# Patient Record
Sex: Female | Born: 1990 | Race: Black or African American | Hispanic: No | Marital: Single | Smoking: Never smoker
Health system: Southern US, Community
[De-identification: ages and names within clinical notes are randomized; demographics above are authoritative.]

## PROBLEM LIST (undated history)

## (undated) ENCOUNTER — Inpatient Hospital Stay (HOSPITAL_COMMUNITY): Payer: Self-pay

## (undated) DIAGNOSIS — Z789 Other specified health status: Secondary | ICD-10-CM

---
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
## 2017-01-19 ENCOUNTER — Inpatient Hospital Stay (HOSPITAL_COMMUNITY): Payer: 59

## 2017-01-19 ENCOUNTER — Inpatient Hospital Stay (HOSPITAL_COMMUNITY)
Admission: AD | Admit: 2017-01-19 | Discharge: 2017-01-19 | Disposition: A | Discharge status: Home / Self Care | Payer: 59 | Source: Ambulatory Visit | Attending: Obstetrics and Gynecology | Admitting: Obstetrics and Gynecology

## 2017-01-19 DIAGNOSIS — O3680X0 Pregnancy with inconclusive fetal viability, not applicable or unspecified: Secondary | ICD-10-CM

## 2017-01-19 DIAGNOSIS — O209 Hemorrhage in early pregnancy, unspecified: Secondary | ICD-10-CM

## 2017-01-19 DIAGNOSIS — O200 Threatened abortion: Secondary | ICD-10-CM | POA: Diagnosis not present

## 2017-01-19 DIAGNOSIS — Z3A01 Less than 8 weeks gestation of pregnancy: Secondary | ICD-10-CM | POA: Insufficient documentation

## 2017-01-19 DIAGNOSIS — O26891 Other specified pregnancy related conditions, first trimester: Secondary | ICD-10-CM | POA: Diagnosis not present

## 2017-01-19 DIAGNOSIS — N898 Other specified noninflammatory disorders of vagina: Secondary | ICD-10-CM | POA: Insufficient documentation

## 2017-01-19 LAB — URINALYSIS, ROUTINE W REFLEX MICROSCOPIC
BILIRUBIN URINE: NEGATIVE
Glucose, UA: 50 mg/dL — AB
KETONES UR: NEGATIVE mg/dL
Nitrite: NEGATIVE
PROTEIN: 30 mg/dL — AB
SPECIFIC GRAVITY, URINE: 1.011 (ref 1.005–1.030)
pH: 5 (ref 5.0–8.0)

## 2017-01-19 LAB — CBC WITH DIFFERENTIAL/PLATELET
BASOS ABS: 0 10*3/uL (ref 0.0–0.1)
Basophils Relative: 0 %
EOS ABS: 0.1 10*3/uL (ref 0.0–0.7)
EOS PCT: 1 %
HCT: 33.7 % — ABNORMAL LOW (ref 36.0–46.0)
Hemoglobin: 11.1 g/dL — ABNORMAL LOW (ref 12.0–15.0)
Lymphocytes Relative: 22 %
Lymphs Abs: 2.9 10*3/uL (ref 0.7–4.0)
MCH: 24.3 pg — ABNORMAL LOW (ref 26.0–34.0)
MCHC: 32.9 g/dL (ref 30.0–36.0)
MCV: 73.9 fL — ABNORMAL LOW (ref 78.0–100.0)
MONO ABS: 0.6 10*3/uL (ref 0.1–1.0)
Monocytes Relative: 4 %
Neutro Abs: 9.7 10*3/uL — ABNORMAL HIGH (ref 1.7–7.7)
Neutrophils Relative %: 73 %
PLATELETS: 345 10*3/uL (ref 150–400)
RBC: 4.56 MIL/uL (ref 3.87–5.11)
RDW: 17.2 % — AB (ref 11.5–15.5)
WBC: 13.4 10*3/uL — AB (ref 4.0–10.5)

## 2017-01-19 LAB — POCT PREGNANCY, URINE: PREG TEST UR: POSITIVE — AB

## 2017-01-19 LAB — HCG, QUANTITATIVE, PREGNANCY: HCG, BETA CHAIN, QUANT, S: 3932 m[IU]/mL — AB (ref <5)

## 2017-01-19 NOTE — MAU Note (Signed)
Pt had positive HPT last week. Woke up around 1am and startd having abd pain and cramping. Went to the br and had some vag bleeding and clots. Continuing to bleeding now cramping is less. Thinks she might have had a miscarriage. Went to urgent care and they sent her here for furhter evaluation.

## 2017-01-19 NOTE — MAU Provider Note (Signed)
History     CSN: [id number]  Arrival date and time: 01/19/17 4098   First Provider Initiated Contact with Patient 01/19/17 2106      Chief Complaint  Patient presents with  . Abdominal Cramping  . Vaginal Bleeding   Vaginal Bleeding  The patient's primary symptoms include pelvic pain and vaginal bleeding. This is a new problem. The current episode started today. The problem occurs constantly. The problem has been unchanged. Pain severity now: 3/10  The problem affects both sides. She is pregnant. Associated symptoms include nausea and vomiting. Pertinent negatives include no chills, dysuria, fever or urgency. The vaginal discharge was bloody. The vaginal bleeding is typical of menses. She has been passing clots. She has not been passing tissue. Nothing aggravates the symptoms. She has tried NSAIDs for the symptoms. The treatment provided moderate relief. Her menstrual history has been regular (LMP 12/08/16).    No past medical history on file.  No past surgical history on file.  No family history on file.  Social History  Substance Use Topics  . Smoking status: Not on file  . Smokeless tobacco: Not on file  . Alcohol use Not on file    Allergies: Allergies not on file  No prescriptions prior to admission.    Review of Systems  Constitutional: Negative for chills and fever.  Gastrointestinal: Positive for nausea and vomiting.  Genitourinary: Positive for pelvic pain and vaginal bleeding. Negative for dysuria and urgency.   Physical Exam   Blood pressure 124/86, pulse 102, temperature 98.8 F (37.1 C), resp. rate 18, height 5\' 3"  (1.6 m), weight 209 lb 1.9 oz (94.9 kg), last menstrual period 12/08/2016.  Physical Exam  Nursing note and vitals reviewed. Constitutional: She is oriented to person, place, and time. She appears well-developed and well-nourished. No distress.  HENT:  Head: Normocephalic.  Cardiovascular: Normal rate.   Respiratory: Effort normal.  GI:  Soft. There is no tenderness. There is no rebound.  Neurological: She is alert and oriented to person, place, and time.  Skin: Skin is warm and dry.  Psychiatric: She has a normal mood and affect.   Results for orders placed or performed during the hospital encounter of 01/19/17 (from the past 24 hour(s))  Urinalysis, Routine w reflex microscopic     Status: Abnormal   Collection Time: 01/19/17  6:44 PM  Result Value Ref Range   Color, Urine YELLOW YELLOW   APPearance HAZY (A) CLEAR   Specific Gravity, Urine 1.011 1.005 - 1.030   pH 5.0 5.0 - 8.0   Glucose, UA 50 (A) NEGATIVE mg/dL   Hgb urine dipstick LARGE (A) NEGATIVE   Bilirubin Urine NEGATIVE NEGATIVE   Ketones, ur NEGATIVE NEGATIVE mg/dL   Protein, ur 30 (A) NEGATIVE mg/dL   Nitrite NEGATIVE NEGATIVE   Leukocytes, UA SMALL (A) NEGATIVE   RBC / HPF TOO NUMEROUS TO COUNT 0 - 5 RBC/hpf   WBC, UA TOO NUMEROUS TO COUNT 0 - 5 WBC/hpf   Bacteria, UA RARE (A) NONE SEEN   Squamous Epithelial / LPF 0-5 (A) NONE SEEN   Mucous PRESENT   Pregnancy, urine POC     Status: Abnormal   Collection Time: 01/19/17  6:54 PM  Result Value Ref Range   Preg Test, Ur POSITIVE (A) NEGATIVE  CBC with Differential/Platelet     Status: Abnormal   Collection Time: 01/19/17  9:21 PM  Result Value Ref Range   WBC 13.4 (H) 4.0 - 10.5 K/uL   RBC 4.56  3.87 - 5.11 MIL/uL   Hemoglobin 11.1 (L) 12.0 - 15.0 g/dL   HCT 16.133.7 (L) 09.636.0 - 04.546.0 %   MCV 73.9 (L) 78.0 - 100.0 fL   MCH 24.3 (L) 26.0 - 34.0 pg   MCHC 32.9 30.0 - 36.0 g/dL   RDW 40.917.2 (H) 81.111.5 - 91.415.5 %   Platelets 345 150 - 400 K/uL   Neutrophils Relative % 73 %   Neutro Abs 9.7 (H) 1.7 - 7.7 K/uL   Lymphocytes Relative 22 %   Lymphs Abs 2.9 0.7 - 4.0 K/uL   Monocytes Relative 4 %   Monocytes Absolute 0.6 0.1 - 1.0 K/uL   Eosinophils Relative 1 %   Eosinophils Absolute 0.1 0.0 - 0.7 K/uL   Basophils Relative 0 %   Basophils Absolute 0.0 0.0 - 0.1 K/uL  hCG, quantitative, pregnancy     Status:  Abnormal   Collection Time: 01/19/17  9:21 PM  Result Value Ref Range   hCG, Beta Chain, Quant, S 3,932 (H) <5 mIU/mL  ABO/Rh     Status: None (Preliminary result)   Collection Time: 01/19/17  9:21 PM  Result Value Ref Range   ABO/RH(D) O POS    [place] Ob Comp Less 14 Wks  Result Date: 01/19/2017 CLINICAL DATA:  Abdominal pain, cramps, and vaginal bleeding today. Estimated gestational age by LMP is 6 weeks 0 days. Quantitative beta HCG is 3,932 EXAM: OBSTETRIC <14 WK US AND TRANSVAGINAL OB US TECHNIQUE: Both transabdominal and transvaginal ultrasound examinations were performed for complete evaluation of the gestation as well as the maternal uterus, adnexal regions, and pelvic cul-de-sac. Transvaginal technique was performed to assess early pregnancy. COMPARISON:  None. FINDINGS: Intrauterine gestational sac: No intrauterine gestational sac is identified. Yolk sac:  Not visualized. Embryo:  Not Visualized. Cardiac Activity: Not Visualized. Maternal uterus/adnexae: Uterus is anteverted. No myometrial mass lesions identified. Endometrial stripe is mildly thickened at 1.4 cm diameter. Endometrium is heterogeneous with swirling of endometrium material noted during real-time imaging. No focal lesion is identified. Both ovaries are visualized and appear normal. Probable corpus luteal cyst on the right ovary. No abnormal adnexal mass lesions are seen. No free fluid in the pelvis. IMPRESSION: No intrauterine gestational sac, yolk sac, or fetal pole identified. Differential considerations include intrauterine pregnancy too early to be sonographically visualized, missed abortion, or ectopic pregnancy. Followup ultrasound is recommended in 10-14 days for further evaluation. Electronically Signed   By: Burman NievesWilliam  Stevens M.D.   On: 01/19/2017 23:08   [place] Ob Transvaginal  Result Date: 01/19/2017 CLINICAL DATA:  Abdominal pain, cramps, and vaginal bleeding today. Estimated gestational age by LMP is 6 weeks 0 days.  Quantitative beta HCG is 3,932 EXAM: OBSTETRIC <14 WK US AND TRANSVAGINAL OB US TECHNIQUE: Both transabdominal and transvaginal ultrasound examinations were performed for complete evaluation of the gestation as well as the maternal uterus, adnexal regions, and pelvic cul-de-sac. Transvaginal technique was performed to assess early pregnancy. COMPARISON:  None. FINDINGS: Intrauterine gestational sac: No intrauterine gestational sac is identified. Yolk sac:  Not visualized. Embryo:  Not Visualized. Cardiac Activity: Not Visualized. Maternal uterus/adnexae: Uterus is anteverted. No myometrial mass lesions identified. Endometrial stripe is mildly thickened at 1.4 cm diameter. Endometrium is heterogeneous with swirling of endometrium material noted during real-time imaging. No focal lesion is identified. Both ovaries are visualized and appear normal. Probable corpus luteal cyst on the right ovary. No abnormal adnexal mass lesions are seen. No free fluid in the pelvis. IMPRESSION: No intrauterine gestational sac,  yolk sac, or fetal pole identified. Differential considerations include intrauterine pregnancy too early to be sonographically visualized, missed abortion, or ectopic pregnancy. Followup ultrasound is recommended in 10-14 days for further evaluation. Electronically Signed   By: Burman Nieves M.D.   On: 01/19/2017 23:08   MAU Course  Procedures  MDM 2316: D/W Dr. Tenny Craw, ok for DC home. Will have patient return on Saturday for repeat HCG.   Assessment and Plan   1. Threatened miscarriage in early pregnancy   2. Vaginal bleeding in pregnancy, first trimester   3. Pregnancy of unknown anatomic location    DC home Comfort measures reviewed  1st Trimester precautions  Bleeding precautions Ectopic precautions RX: none  Return to MAU as needed FU with OB as planned  Follow-up Information    THE [facility] [place] MATERNITY ADMISSIONS Follow up.   Why:  Return Saturday for repeat HCG   Contact information: [address] [id number] [place] [address] [phone]           Tawnya Crook 01/19/2017, 9:07 PM

## 2017-01-19 NOTE — Discharge Instructions (Signed)
Threatened Miscarriage A threatened miscarriage is when you have vaginal bleeding during your first 20 weeks of pregnancy but the pregnancy has not ended. Your doctor will do tests to make sure you are still pregnant. The cause of the bleeding may not be known. This condition does not mean your pregnancy will end. It does increase the risk of it ending (complete miscarriage). Follow these instructions at home:  Make sure you keep all your doctor visits for prenatal care.  Get plenty of rest.  Do not have sex or use tampons if you have vaginal bleeding.  Do not douche.  Do not smoke or use drugs.  Do not drink alcohol.  Avoid caffeine. Contact a doctor if:  You have light bleeding from your vagina.  You have belly pain or cramping.  You have a fever. Get help right away if:  You have heavy bleeding from your vagina.  You have clots of blood coming from your vagina.  You have bad pain or cramps in your low back or belly.  You have fever, chills, and bad belly pain. This information is not intended to replace advice given to you by your health care provider. Make sure you discuss any questions you have with your health care provider. Document Released: 11/10/2008 Document Revised: 05/05/2016 Document Reviewed: 09/24/2013 Elsevier Interactive Patient Education  2017 [employer] Ectopic Pregnancy An ectopic pregnancy is when the fertilized egg attaches (implants) outside the uterus. Most ectopic pregnancies occur in one of the tubes where eggs travel from the ovary to the uterus (fallopian tubes), but the implanting can occur in other locations. In rare cases, ectopic pregnancies occur on the ovary, intestine, pelvis, abdomen, or cervix. In an ectopic pregnancy, the fertilized egg does not have the ability to develop into a normal, healthy baby. A ruptured ectopic pregnancy is one in which tearing or bursting of a fallopian tube causes internal bleeding. Often, there is  intense lower abdominal pain, and vaginal bleeding sometimes occurs. Having an ectopic pregnancy can be life-threatening. If this dangerous condition is not treated, it can lead to blood loss, shock, or even death. What are the causes? The most common cause of this condition is damage to one of the fallopian tubes. A fallopian tube may be narrowed or blocked, and that keeps the fertilized egg from reaching the uterus. What increases the risk? This condition is more likely to develop in women of childbearing age who have different levels of risk. The levels of risk can be divided into three categories. High risk  You have gone through infertility treatment.  You have had an ectopic pregnancy before.  You have had surgery on the fallopian tubes, or another surgical procedure, such as an abortion.  You have had surgery to have the fallopian tubes tied (tubal ligation).  You have problems or diseases of the fallopian tubes.  You have been exposed to diethylstilbestrol (DES). This medicine was used until 1971, and it had effects on babies whose mothers took the medicine.  You become pregnant while using an IUD (intrauterine device) for birth control. Moderate risk  You have a history of infertility.  You have had an STI (sexually transmitted infection).  You have a history of pelvic inflammatory disease (PID).  You have scarring from endometriosis.  You have multiple sexual partners.  You smoke. Low risk  You have had pelvic surgery.  You use vaginal douches.  You became sexually active before age 52. What are the signs or symptoms? Common  symptoms of this condition include normal pregnancy symptoms, such as missing a period, nausea, tiredness, abdominal pain, breast tenderness, and bleeding. However, ectopic pregnancy will have additional symptoms, such as:  Pain with intercourse.  Irregular vaginal bleeding or spotting.  Cramping or pain on one side or in the lower  abdomen.  Fast heartbeat, low blood pressure, and sweating.  Passing out while having a bowel movement. Symptoms of a ruptured ectopic pregnancy and internal bleeding may include:  Sudden, severe pain in the abdomen and pelvis.  Dizziness, weakness, light-headedness, or fainting.  Pain in the shoulder or neck area. How is this diagnosed? This condition is diagnosed by:  A pelvic exam to locate pain or a mass in the abdomen.  A pregnancy test. This blood test checks for the presence as well as the specific level of pregnancy hormone in the bloodstream.  Ultrasound. This is performed if a pregnancy test is positive. In this test, a probe is inserted into the vagina. The probe will detect a fetus, possibly in a location other than the uterus.  Taking a sample of uterus tissue (dilation and curettage, or D&C).  Surgery to perform a visual exam of the inside of the abdomen using a thin, lighted tube that has a tiny camera on the end (laparoscope).  Culdocentesis. This procedure involves inserting a needle at the top of the vagina, behind the uterus. If blood is present in this area, it may indicate that a fallopian tube is torn. How is this treated? This condition is treated with medicine or surgery. Medicine  An injection of a medicine (methotrexate) may be given to cause the pregnancy tissue to be absorbed. This medicine may save your fallopian tube. It may be given if:  The diagnosis is made early, with no signs of active bleeding.  The fallopian tube has not ruptured.  You are considered to be a good candidate for the medicine. Usually, pregnancy hormone blood levels are checked after methotrexate treatment. This is to be sure that the medicine is effective. It may take 4-6 weeks for the pregnancy to be absorbed. Most pregnancies will be absorbed by 3 weeks. Surgery  A laparoscope may be used to remove the pregnancy tissue.  If severe internal bleeding occurs, a larger cut  (incision) may be made in the lower abdomen (laparotomy) to remove the fetus and placenta. This is done to stop the bleeding.  Part or all of the fallopian tube may be removed (salpingectomy) along with the fetus and placenta. The fallopian tube may also be repaired during the surgery.  In very rare circumstances, removal of the uterus (hysterectomy) may be required.  After surgery, pregnancy hormone testing may be done to be sure that there is no pregnancy tissue left. Whether your treatment is medicine or surgery, you may receive a Rho (D) immune globulin shot to prevent problems with any future pregnancy. This shot may be given if:  You are Rh-negative and the baby's father is Rh-positive.  You are Rh-negative and you do not know the Rh type of the baby's father. Follow these instructions at home:  Rest and limit your activity after the procedure for as long as told by your health care provider.  Until your health care provider says that it is safe:  Do not lift anything that is heavier than 10 lb (4.5 kg), or the limit that your health care provider tells you.  Avoid physical exercise and any movement that requires effort (is strenuous).  To help  prevent constipation:  Eat a healthy diet that includes fruits, vegetables, and whole grains.  Drink 6-8 glasses of water per day. Get help right away if:  You develop worsening pain that is not relieved by medicine.  You have:  A fever or chills.  Vaginal bleeding.  Redness and swelling at the incision site.  Nausea and vomiting.  You feel dizzy or weak.  You feel light-headed or you faint. This information is not intended to replace advice given to you by your health care provider. Make sure you discuss any questions you have with your health care provider. Document Released: 01/05/2005 Document Revised: 07/27/2016 Document Reviewed: 06/29/2016 Elsevier Interactive Patient Education  2017 [employer].

## 2017-01-20 LAB — ABO/RH: ABO/RH(D): O POS

## 2017-01-21 ENCOUNTER — Encounter (HOSPITAL_COMMUNITY): Payer: Self-pay | Admitting: *Deleted

## 2017-01-21 ENCOUNTER — Inpatient Hospital Stay (HOSPITAL_COMMUNITY)
Admission: AD | Admit: 2017-01-21 | Discharge: 2017-01-21 | Disposition: A | Discharge status: Home / Self Care | Payer: 59 | Source: Ambulatory Visit | Attending: Obstetrics and Gynecology | Admitting: Obstetrics and Gynecology

## 2017-01-21 DIAGNOSIS — O039 Complete or unspecified spontaneous abortion without complication: Secondary | ICD-10-CM | POA: Diagnosis not present

## 2017-01-21 LAB — HCG, QUANTITATIVE, PREGNANCY: hCG, Beta Chain, Quant, S: 678 m[IU]/mL — ABNORMAL HIGH (ref <5)

## 2017-01-21 NOTE — MAU Note (Signed)
Judeth HornErin Lawrence NP in Triage to discuss test result and plan of care with pt

## 2017-01-21 NOTE — MAU Provider Note (Signed)
History   [id number]   Chief Complaint  Patient presents with  . Follow-up    HPI Megan Mercado is a 26 y.o. female G1P0 here for follow-up BHCG.  Upon review of the records patient was first seen on 2/8 for vaginal bleeding. BHCG on that day was 3,932. Ultrasound showed no iup or adnexal mass.Pt discharged home with ectopic precautions to f/u for repeat BHCG. Pt here today with no report of abdominal pain or vaginal bleeding. All other systems negative.    Patient's last menstrual period was 12/08/2016.  OB History  Gravida Para Term Preterm AB Living  1            SAB TAB Ectopic Multiple Live Births               # Outcome Date GA Lbr Len/2nd Weight Sex Delivery Anes PTL Lv  1 Current               No past medical history on file.  No family history on file.  Social History   Social History  . Marital status: Single    Spouse name: N/A  . Number of children: N/A  . Years of education: N/A   Social History Main Topics  . Smoking status: Not on file  . Smokeless tobacco: Not on file  . Alcohol use Not on file  . Drug use: Unknown  . Sexual activity: Not on file   Other Topics Concern  . Not on file   Social History Narrative  . No narrative on file    Allergies not on file  No current facility-administered medications on file prior to encounter.    No current outpatient prescriptions on file prior to encounter.     Physical Exam   Vitals:   01/21/17 2053  BP: 124/71  Pulse: 99  Resp: 18  Temp: 98.7 F (37.1 C)  Weight: 211 lb 3.2 oz (95.8 kg)  Height: 5\' 3"  (1.6 m)    Physical Exam  Nursing note and vitals reviewed. Constitutional: She is oriented to person, place, and time. She appears well-developed and well-nourished. No distress.  HENT:  Head: Normocephalic and atraumatic.  Eyes: Conjunctivae are normal. Right eye exhibits no discharge. Left eye exhibits no discharge. No scleral icterus.  Neck: Normal range of motion.   Respiratory: Effort normal. No respiratory distress.  Neurological: She is alert and oriented to person, place, and time.  Skin: Skin is warm and dry. She is not diaphoretic.  Psychiatric: She has a normal mood and affect. Her behavior is normal. Judgment and thought content normal.    MAU Course  Procedures Results for orders placed or performed during the hospital encounter of 01/21/17 (from the past 24 hour(s))  hCG, quantitative, pregnancy     Status: Abnormal   Collection Time: 01/21/17  8:53 PM  Result Value Ref Range   hCG, Beta Chain, Quant, S 678 (H) <5 mIU/mL    MDM Component     Latest Ref Rng & Units 01/19/2017 01/21/2017  HCG, Beta Chain, Quant, S     <5 mIU/mL 3,932 (H) 678 (H)   Significant drop in BHCG -- discussed results with patient S/w Dr. Henderson Cloud regarding results. Will have pt f/u in office in 2 weeks  Assessment and Plan  26 y.o. G1P0 at Unknown wks Pregnancy Follow-up BHCG 1. Miscarriage    P: Discharge home Comfort pack given Schedule f/u in office in 2 weeks Discussed reasons to return to MAU  Pelvic rest   Judeth HornErin Lawrence, NP 01/21/2017 10:03 PM

## 2017-01-21 NOTE — Discharge Instructions (Signed)

## 2017-01-21 NOTE — MAU Note (Signed)
Here for repeat lab work. Bleeding is getting a lot lighter than previously. No pain currently. Had some abd pain earlier but no pain now

## 2017-11-28 IMAGING — US US OB COMP LESS 14 WK
1 series · 15 of 28 positions shown · non-contrast
Comparison: None.

CLINICAL DATA: Abdominal pain, cramps, and vaginal bleeding today.
Estimated gestational age by LMP is 6 weeks 0 days. Quantitative
beta HCG is 3,932

EXAM:
OBSTETRIC <14 WK US AND TRANSVAGINAL OB US
TECHNIQUE: Both transabdominal and transvaginal ultrasound examinations were
performed for complete evaluation of the gestation as well as the
maternal uterus, adnexal regions, and pelvic cul-de-sac.
Transvaginal technique was performed to assess early pregnancy.

[Series 1: us ob comp less 14 wk · 15 of 50 slices shown]
[im 1/50]
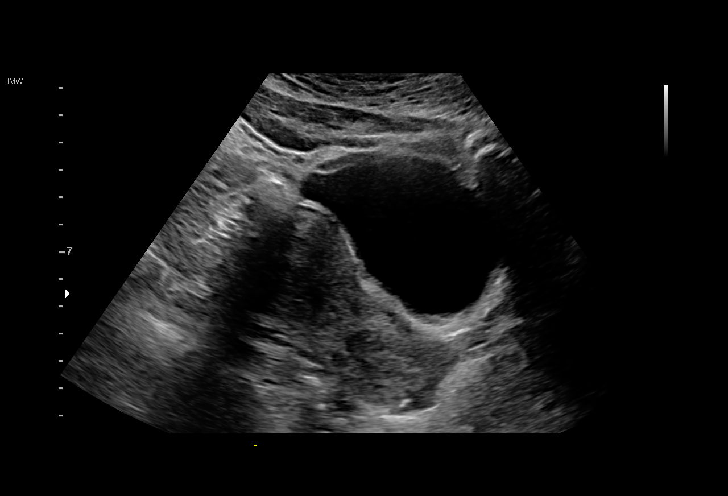
[im 4/50]
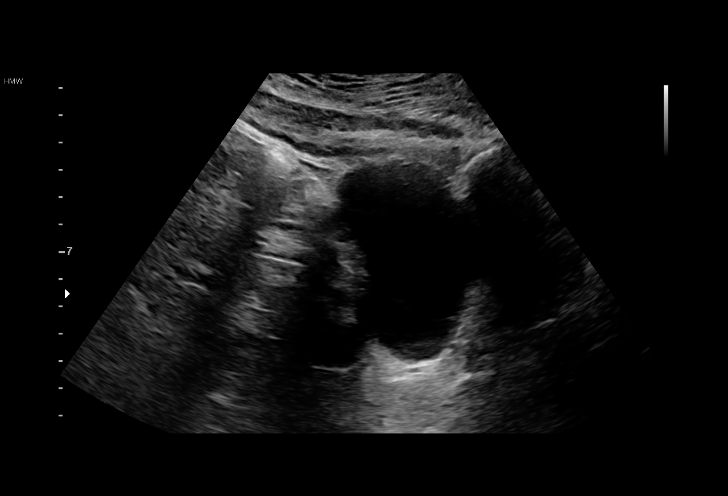
[im 8/50]
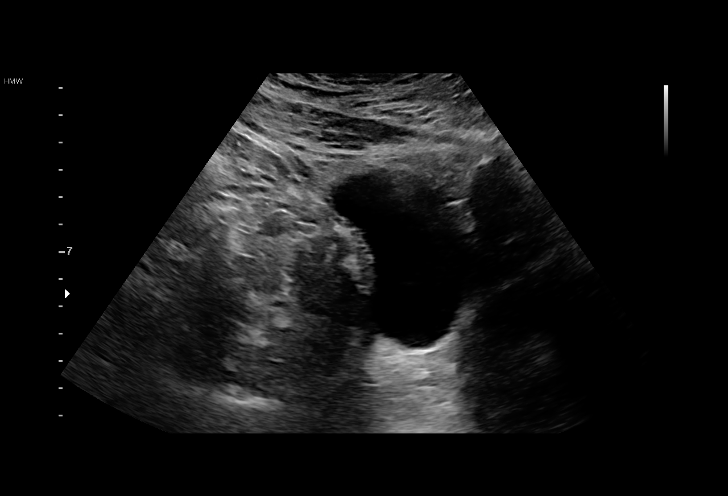
[im 11/50]
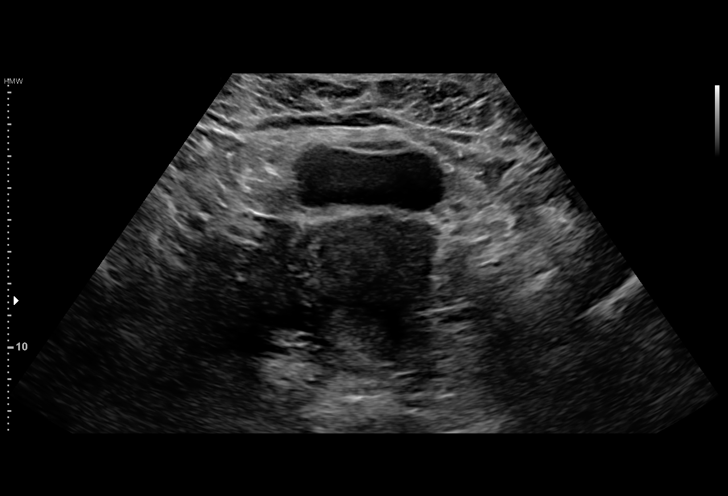
[im 15/50]
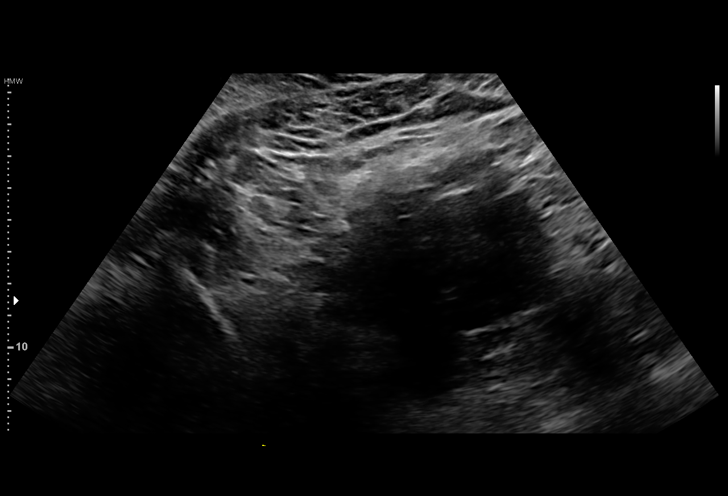
[im 19/50]
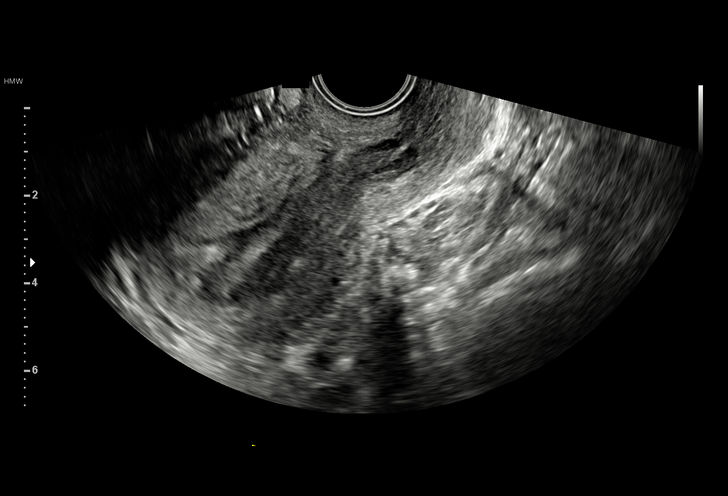
[im 22/50]
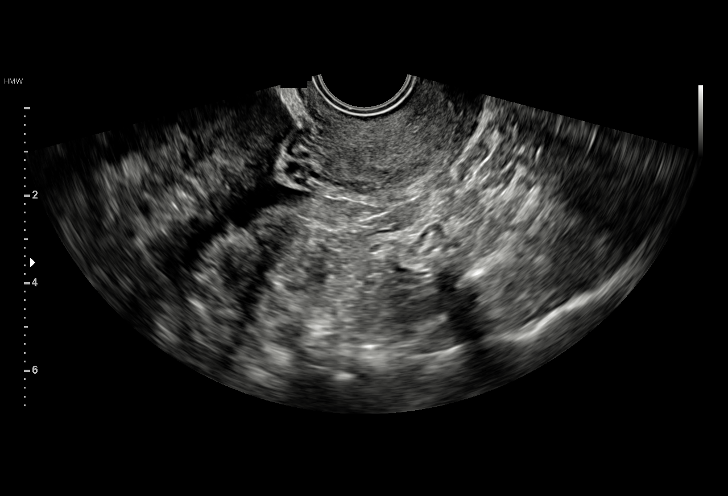
[im 26/50]
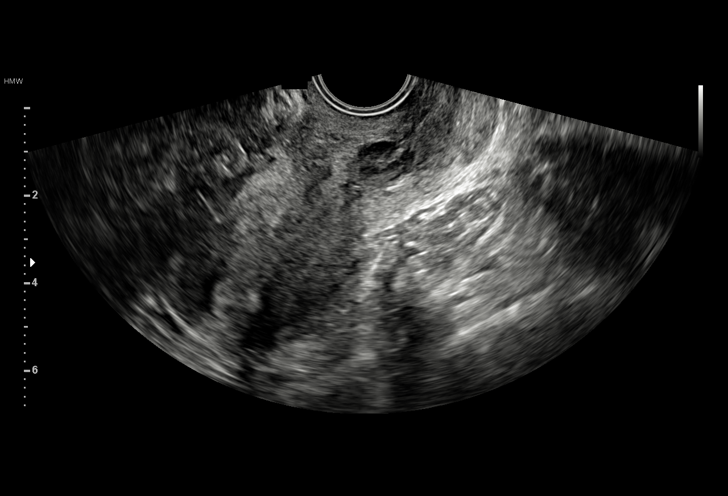
[im 28/50]
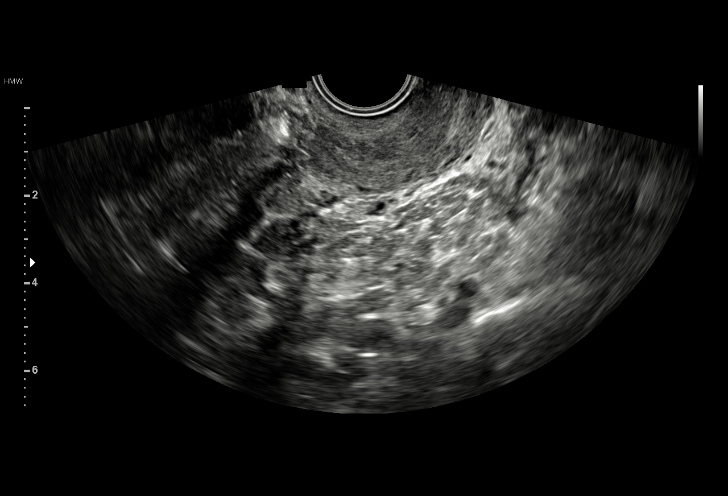
[im 31/50]
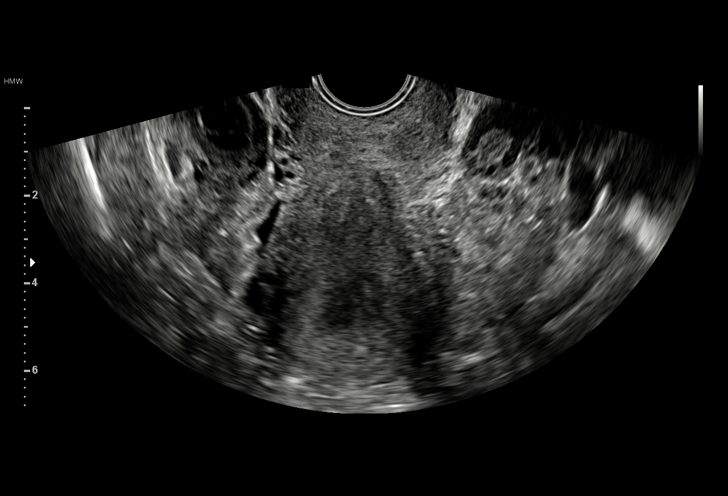
[im 35/50]
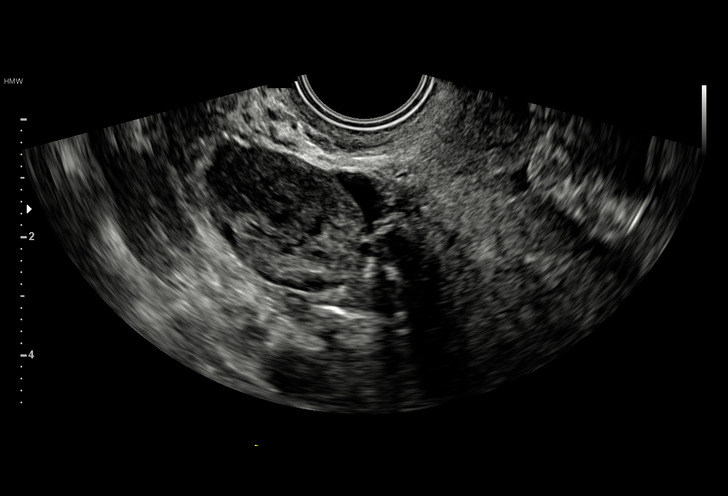
[im 39/50]
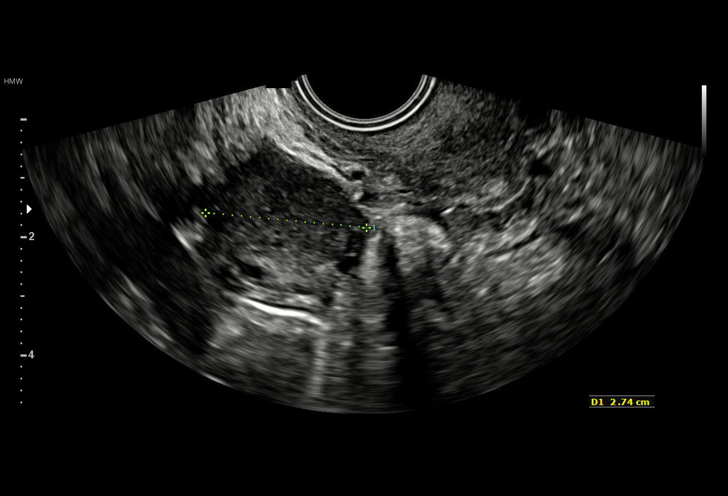
[im 42/50]
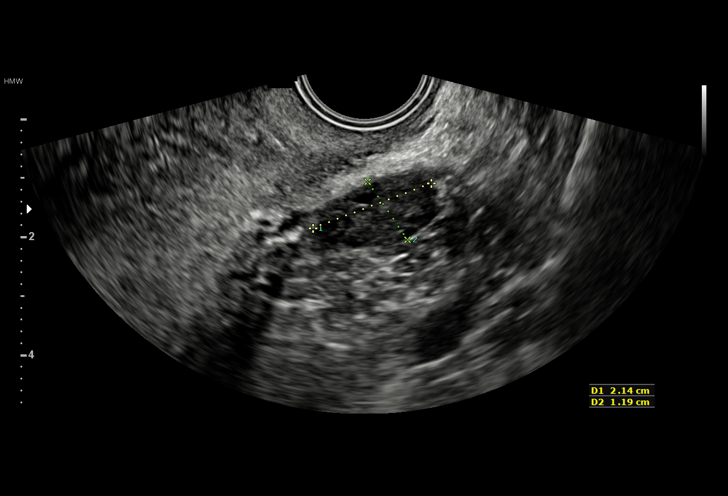
[im 46/50]
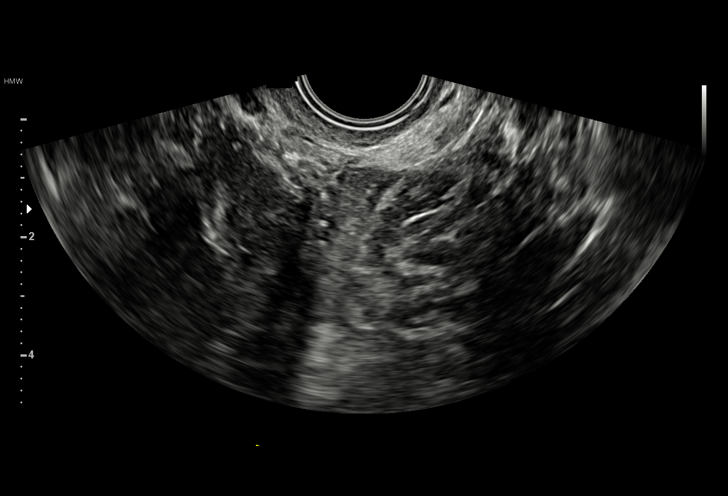
[im 50/50]
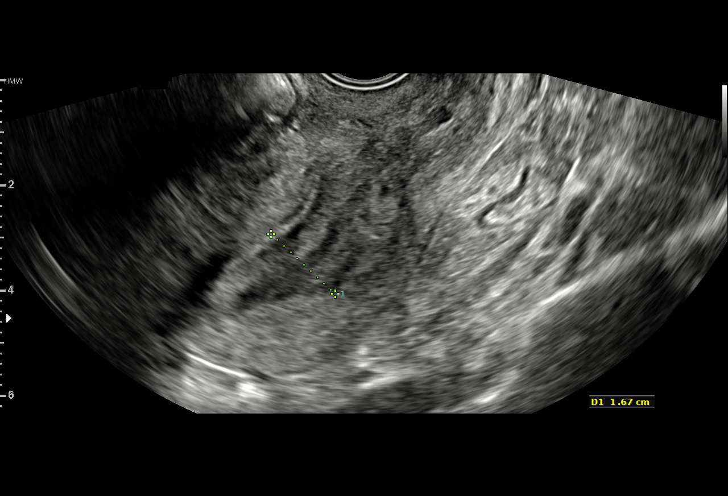

[15 of 28 positions shown; findings below may reference images not displayed]

FINDINGS: Intrauterine gestational sac: No intrauterine gestational sac is
identified.

Yolk sac:  Not visualized.

Embryo:  Not Visualized.

Cardiac Activity: Not Visualized.

Maternal uterus/adnexae: Uterus is anteverted. No myometrial mass
lesions identified. Endometrial stripe is mildly thickened at 1.4 cm
diameter. Endometrium is heterogeneous with swirling of endometrium
material noted during real-time imaging. No focal lesion is
identified. Both ovaries are visualized and appear normal. Probable
corpus luteal cyst on the right ovary. No abnormal adnexal mass
lesions are seen. No free fluid in the pelvis.
IMPRESSION: No intrauterine gestational sac, yolk sac, or fetal pole identified.
Differential considerations include intrauterine pregnancy too early
to be sonographically visualized, missed abortion, or ectopic
pregnancy. Followup ultrasound is recommended in 10-14 days for
further evaluation.

## 2018-01-20 ENCOUNTER — Encounter (HOSPITAL_COMMUNITY): Payer: Self-pay

## 2019-12-13 NOTE — L&D Delivery Note (Signed)
Delivery Note Patient was admitted with PPROM at 3 cm.  She progressed rapidly to 10 cm.  She pushed well for < 10 minutes.  At 6:47 AM a viable female was delivered via Vaginal, Spontaneous (Presentation: Right Occiput Anterior).  APGAR: 9 , 9 ; weight pending .   Placenta status: Spontaneous, Intact.  Cord: 3 vessels with the following complications: None.  Cord pH: n/a  Anesthesia: Epidural Episiotomy: None Lacerations:  Hemostatic bilateral lacerations Suture Repair: n/a Est. Blood Loss (mL): 125  Mom to postpartum.  Baby to Couplet care / Skin to Skin.  Megan Mercado Megan Mercado Megan Mercado 07/14/2020, 7:06 AM

## 2020-01-20 LAB — OB RESULTS CONSOLE HIV ANTIBODY (ROUTINE TESTING): HIV: NONREACTIVE

## 2020-01-20 LAB — OB RESULTS CONSOLE HEPATITIS B SURFACE ANTIGEN: Hepatitis B Surface Ag: NEGATIVE

## 2020-02-17 LAB — OB RESULTS CONSOLE GC/CHLAMYDIA
Chlamydia: NEGATIVE
Gonorrhea: NEGATIVE

## 2020-07-09 LAB — OB RESULTS CONSOLE GBS: GBS: NEGATIVE

## 2020-07-14 ENCOUNTER — Inpatient Hospital Stay (HOSPITAL_COMMUNITY)
Admission: AD | Admit: 2020-07-14 | Discharge: 2020-07-15 | DRG: 805 | Disposition: A | Discharge status: Home / Self Care | Payer: 59 | Attending: Obstetrics | Admitting: Obstetrics

## 2020-07-14 ENCOUNTER — Encounter (HOSPITAL_COMMUNITY): Payer: Self-pay | Admitting: Emergency Medicine

## 2020-07-14 ENCOUNTER — Other Ambulatory Visit: Payer: Self-pay

## 2020-07-14 ENCOUNTER — Inpatient Hospital Stay (HOSPITAL_COMMUNITY): Payer: 59 | Admitting: Anesthesiology

## 2020-07-14 DIAGNOSIS — O9902 Anemia complicating childbirth: Secondary | ICD-10-CM | POA: Diagnosis present

## 2020-07-14 DIAGNOSIS — D573 Sickle-cell trait: Secondary | ICD-10-CM | POA: Diagnosis present

## 2020-07-14 DIAGNOSIS — O42013 Preterm premature rupture of membranes, onset of labor within 24 hours of rupture, third trimester: Secondary | ICD-10-CM | POA: Diagnosis present

## 2020-07-14 DIAGNOSIS — Z20822 Contact with and (suspected) exposure to covid-19: Secondary | ICD-10-CM | POA: Diagnosis present

## 2020-07-14 DIAGNOSIS — O42913 Preterm premature rupture of membranes, unspecified as to length of time between rupture and onset of labor, third trimester: Principal | ICD-10-CM

## 2020-07-14 DIAGNOSIS — Z3A36 36 weeks gestation of pregnancy: Secondary | ICD-10-CM | POA: Diagnosis not present

## 2020-07-14 HISTORY — DX: Other specified health status: Z78.9

## 2020-07-14 LAB — TYPE AND SCREEN
ABO/RH(D): O POS
Antibody Screen: NEGATIVE

## 2020-07-14 LAB — CBC
HCT: 32.9 % — ABNORMAL LOW (ref 36.0–46.0)
Hemoglobin: 10.5 g/dL — ABNORMAL LOW (ref 12.0–15.0)
MCH: 25.3 pg — ABNORMAL LOW (ref 26.0–34.0)
MCHC: 31.9 g/dL (ref 30.0–36.0)
MCV: 79.3 fL — ABNORMAL LOW (ref 80.0–100.0)
Platelets: 299 10*3/uL (ref 150–400)
RBC: 4.15 MIL/uL (ref 3.87–5.11)
RDW: 14.2 % (ref 11.5–15.5)
WBC: 13.5 10*3/uL — ABNORMAL HIGH (ref 4.0–10.5)
nRBC: 0 % (ref 0.0–0.2)

## 2020-07-14 LAB — SARS CORONAVIRUS 2 BY RT PCR (HOSPITAL ORDER, PERFORMED IN ~~LOC~~ HOSPITAL LAB): SARS Coronavirus 2: NEGATIVE

## 2020-07-14 LAB — RPR: RPR Ser Ql: NONREACTIVE

## 2020-07-14 MED ORDER — DIPHENHYDRAMINE HCL 50 MG/ML IJ SOLN: 12.5000 mg | INTRAMUSCULAR | Status: DC | PRN

## 2020-07-14 MED ORDER — OXYCODONE-ACETAMINOPHEN 5-325 MG PO TABS: 1.0000 | ORAL_TABLET | ORAL | Status: DC | PRN

## 2020-07-14 MED ORDER — OXYCODONE-ACETAMINOPHEN 5-325 MG PO TABS: 2.0000 | ORAL_TABLET | ORAL | Status: DC | PRN

## 2020-07-14 MED ORDER — ONDANSETRON HCL 4 MG/2ML IJ SOLN
4.0000 mg | Freq: Four times a day (QID) | INTRAMUSCULAR | Status: DC | PRN
  Administered 2020-07-14: 4 mg via INTRAVENOUS
  Filled 2020-07-14: qty 2

## 2020-07-14 MED ORDER — WITCH HAZEL-GLYCERIN EX PADS: 1.0000 "application " | MEDICATED_PAD | CUTANEOUS | Status: DC | PRN

## 2020-07-14 MED ORDER — SOD CITRATE-CITRIC ACID 500-334 MG/5ML PO SOLN: 30.0000 mL | ORAL | Status: DC | PRN

## 2020-07-14 MED ORDER — ACETAMINOPHEN 325 MG PO TABS: 650.0000 mg | ORAL_TABLET | ORAL | Status: DC | PRN

## 2020-07-14 MED ORDER — OXYTOCIN BOLUS FROM INFUSION
333.0000 mL | Freq: Once | INTRAVENOUS | Status: AC
  Administered 2020-07-14: 333 mL via INTRAVENOUS

## 2020-07-14 MED ORDER — OXYCODONE HCL 5 MG PO TABS: 10.0000 mg | ORAL_TABLET | ORAL | Status: DC | PRN

## 2020-07-14 MED ORDER — FENTANYL CITRATE (PF) 100 MCG/2ML IJ SOLN
50.0000 ug | INTRAMUSCULAR | Status: DC | PRN
  Administered 2020-07-14 (×2): 100 ug via INTRAVENOUS
  Filled 2020-07-14 (×2): qty 2

## 2020-07-14 MED ORDER — OXYTOCIN-SODIUM CHLORIDE 30-0.9 UT/500ML-% IV SOLN
2.5000 [IU]/h | INTRAVENOUS | Status: DC
  Administered 2020-07-14: 2.5 [IU]/h via INTRAVENOUS
  Filled 2020-07-14: qty 500

## 2020-07-14 MED ORDER — TETANUS-DIPHTH-ACELL PERTUSSIS 5-2.5-18.5 LF-MCG/0.5 IM SUSP: 0.5000 mL | Freq: Once | INTRAMUSCULAR | Status: DC

## 2020-07-14 MED ORDER — PHENYLEPHRINE 40 MCG/ML (10ML) SYRINGE FOR IV PUSH (FOR BLOOD PRESSURE SUPPORT)
80.0000 ug | PREFILLED_SYRINGE | INTRAVENOUS | Status: DC | PRN
  Filled 2020-07-14: qty 10

## 2020-07-14 MED ORDER — LIDOCAINE HCL (PF) 1 % IJ SOLN: 30.0000 mL | INTRAMUSCULAR | Status: DC | PRN

## 2020-07-14 MED ORDER — LACTATED RINGERS IV SOLN
500.0000 mL | Freq: Once | INTRAVENOUS | Status: AC
  Administered 2020-07-14: 500 mL via INTRAVENOUS

## 2020-07-14 MED ORDER — LIDOCAINE HCL (PF) 1 % IJ SOLN
INTRAMUSCULAR | Status: DC | PRN
  Administered 2020-07-14: 5 mL via EPIDURAL

## 2020-07-14 MED ORDER — ZOLPIDEM TARTRATE 5 MG PO TABS: 5.0000 mg | ORAL_TABLET | Freq: Every evening | ORAL | Status: DC | PRN

## 2020-07-14 MED ORDER — FENTANYL-BUPIVACAINE-NACL 0.5-0.125-0.9 MG/250ML-% EP SOLN
12.0000 mL/h | EPIDURAL | Status: DC | PRN
  Filled 2020-07-14: qty 250

## 2020-07-14 MED ORDER — ONDANSETRON HCL 4 MG/2ML IJ SOLN: 4.0000 mg | INTRAMUSCULAR | Status: DC | PRN

## 2020-07-14 MED ORDER — IBUPROFEN 600 MG PO TABS
600.0000 mg | ORAL_TABLET | Freq: Four times a day (QID) | ORAL | Status: DC
  Administered 2020-07-14 – 2020-07-15 (×4): 600 mg via ORAL
  Filled 2020-07-14 (×4): qty 1

## 2020-07-14 MED ORDER — DIPHENHYDRAMINE HCL 25 MG PO CAPS: 25.0000 mg | ORAL_CAPSULE | Freq: Four times a day (QID) | ORAL | Status: DC | PRN

## 2020-07-14 MED ORDER — SODIUM CHLORIDE (PF) 0.9 % IJ SOLN
INTRAMUSCULAR | Status: DC | PRN
  Administered 2020-07-14: 12 mL/h via EPIDURAL

## 2020-07-14 MED ORDER — EPHEDRINE 5 MG/ML INJ
10.0000 mg | INTRAVENOUS | Status: DC | PRN
  Filled 2020-07-14: qty 2

## 2020-07-14 MED ORDER — BENZOCAINE-MENTHOL 20-0.5 % EX AERO: 1.0000 "application " | INHALATION_SPRAY | CUTANEOUS | Status: DC | PRN

## 2020-07-14 MED ORDER — LACTATED RINGERS IV SOLN: INTRAVENOUS | Status: DC

## 2020-07-14 MED ORDER — DIBUCAINE (PERIANAL) 1 % EX OINT: 1.0000 "application " | TOPICAL_OINTMENT | CUTANEOUS | Status: DC | PRN

## 2020-07-14 MED ORDER — SIMETHICONE 80 MG PO CHEW: 80.0000 mg | CHEWABLE_TABLET | ORAL | Status: DC | PRN

## 2020-07-14 MED ORDER — COCONUT OIL OIL: 1.0000 "application " | TOPICAL_OIL | Status: DC | PRN

## 2020-07-14 MED ORDER — OXYCODONE HCL 5 MG PO TABS: 5.0000 mg | ORAL_TABLET | ORAL | Status: DC | PRN

## 2020-07-14 MED ORDER — SENNOSIDES-DOCUSATE SODIUM 8.6-50 MG PO TABS
2.0000 | ORAL_TABLET | ORAL | Status: DC
  Administered 2020-07-15: 2 via ORAL
  Filled 2020-07-14: qty 2

## 2020-07-14 MED ORDER — ONDANSETRON HCL 4 MG PO TABS: 4.0000 mg | ORAL_TABLET | ORAL | Status: DC | PRN

## 2020-07-14 MED ORDER — LACTATED RINGERS IV SOLN: 500.0000 mL | INTRAVENOUS | Status: DC | PRN

## 2020-07-14 MED ORDER — PRENATAL MULTIVITAMIN CH
1.0000 | ORAL_TABLET | Freq: Every day | ORAL | Status: DC
  Administered 2020-07-14 – 2020-07-15 (×2): 1 via ORAL
  Filled 2020-07-14 (×2): qty 1

## 2020-07-14 NOTE — Anesthesia Preprocedure Evaluation (Signed)
Anesthesia Evaluation  Patient identified by MRN, date of birth, ID band Patient awake    Reviewed: Allergy & Precautions, NPO status , Patient's Chart, lab work & pertinent test results  Airway Mallampati: II  TM Distance: >3 FB Neck ROM: Full    Dental no notable dental hx. (+) Teeth Intact, Dental Advisory Given   Pulmonary neg pulmonary ROS,    Pulmonary exam normal breath sounds clear to auscultation       Cardiovascular Exercise Tolerance: Good negative cardio ROS Normal cardiovascular exam Rhythm:Regular Rate:Normal     Neuro/Psych negative neurological ROS  negative psych ROS   GI/Hepatic negative GI ROS, Neg liver ROS,   Endo/Other  negative endocrine ROS  Renal/GU negative Renal ROS     Musculoskeletal   Abdominal (+) + obese,   Peds  Hematology Lab Results      Component                Value               Date                      WBC                      13.5 (H)            07/14/2020                HGB                      10.5 (L)            07/14/2020                HCT                      32.9 (L)            07/14/2020                MCV                      79.3 (L)            07/14/2020                PLT                      299                 07/14/2020              Anesthesia Other Findings   Reproductive/Obstetrics (+) Pregnancy                             Anesthesia Physical Anesthesia Plan  ASA: III  Anesthesia Plan: Epidural   Post-op Pain Management:    Induction:   PONV Risk Score and Plan:   Airway Management Planned:   Additional Equipment:   Intra-op Plan:   Post-operative Plan:   Informed Consent: I have reviewed the patients History and Physical, chart, labs and discussed the procedure including the risks, benefits and alternatives for the proposed anesthesia with the patient or authorized representative who has indicated his/her  understanding and acceptance.       Plan Discussed with:   Anesthesia Plan Comments: (36.5 Wk  G2P0 for  LEA)       Anesthesia Quick Evaluation

## 2020-07-14 NOTE — Anesthesia Procedure Notes (Signed)
Epidural Patient location during procedure: OB Start time: 07/14/2020 5:14 AM End time: 07/14/2020 5:28 AM  Staffing Anesthesiologist: Trevor Iha, MD Performed: anesthesiologist   Preanesthetic Checklist Completed: patient identified, IV checked, site marked, risks and benefits discussed, surgical consent, monitors and equipment checked, pre-op evaluation and timeout performed  Epidural Patient position: sitting Prep: DuraPrep and site prepped and draped Patient monitoring: continuous pulse ox and blood pressure Approach: midline Location: L3-L4 Injection technique: LOR air  Needle:  Needle type: Tuohy  Needle gauge: 17 G Needle length: 9 cm and 9 Needle insertion depth: 9 cm Catheter type: closed end flexible Catheter size: 19 Gauge Catheter at skin depth: 15 cm Test dose: negative  Assessment Events: blood not aspirated, injection not painful, no injection resistance, no paresthesia and negative IV test  Additional Notes Patient identified. Risks/Benefits/Options discussed with patient including but not limited to bleeding, infection, nerve damage, paralysis, failed block, incomplete pain control, headache, blood pressure changes, nausea, vomiting, reactions to medication both or allergic, itching and postpartum back pain. Confirmed with bedside nurse the patient's most recent platelet count. Confirmed with patient that they are not currently taking any anticoagulation, have any bleeding history or any family history of bleeding disorders. Patient expressed understanding and wished to proceed. All questions were answered. Sterile technique was used throughout the entire procedure. Please see nursing notes for vital signs. Test dose was given through epidural needle and negative prior to continuing to dose epidural or start infusion. Warning signs of high block given to the patient including shortness of breath, tingling/numbness in hands, complete motor block, or any concerning  symptoms with instructions to call for help. Patient was given instructions on fall risk and not to get out of bed. All questions and concerns addressed with instructions to call with any issues. 1 Attempt (S) . Patient tolerated procedure well.

## 2020-07-14 NOTE — ED Triage Notes (Signed)
Patient is [redacted] weeks pregnant reports her water broke with abdominal contractions this evening , evaluated by PA advised RN to transport patient to MAU.

## 2020-07-14 NOTE — H&P (Signed)
29 y.o. G2P0010 @ [redacted]w[redacted]d presents with contractions and leakage of clear fluid since 2230.  In MAU she was found to be grossly ruptured. Otherwise has good fetal movement and no bleeding.  Pregnancy c/b: 1. History of sickle cell trait: Reports FOB negative  Past Medical History:  Diagnosis Date  . Medical history non-contributory    History reviewed. No pertinent surgical history.  OB History  Gravida Para Term Preterm AB Living  2       1    SAB TAB Ectopic Multiple Live Births  1            # Outcome Date GA Lbr Len/2nd Weight Sex Delivery Anes PTL Lv  2 Current           1 SAB             Social History   Socioeconomic History  . Marital status: Single    Spouse name: Not on file  . Number of children: Not on file  . Years of education: Not on file  . Highest education level: Not on file  Occupational History  . Not on file  Tobacco Use  . Smoking status: Never Smoker  . Smokeless tobacco: Never Used  Vaping Use  . Vaping Use: Never used  Substance and Sexual Activity  . Alcohol use: Never  . Drug use: Never  . Sexual activity: Yes  Other Topics Concern  . Not on file  Social History Narrative  . Not on file       Augmentin [amoxicillin-pot clavulanate]    Prenatal Transfer Tool  Maternal Diabetes: No Genetic Screening: Normal Maternal Ultrasounds/Referrals: Normal Fetal Ultrasounds or other Referrals:  None Maternal Substance Abuse:  No Significant Maternal Medications:  Meds include: Other:  aspirin 81mg  Significant Maternal Lab Results: Group B Strep negative  ABO, Rh: --/--/O POS (08/03 0211) Antibody: NEG (08/03 0211) Rubella:  Immune  RPR:   NR HBsAg: Negative (02/08 0000)  HIV: Non-reactive (02/08 0000)  GBS: Negative/-- (07/29 0000)      Vitals:   07/14/20 0545 07/14/20 0550  BP: 138/78 131/72  Pulse: 100 (!) 144  Resp:  18  Temp:    SpO2: 98% 97%     General:  NAD Abdomen:  soft, gravid, EFW 5.5-6# Ex:  trace edema SVE:   9/5/100/+1  FHTs:  130s, moderate variability  Toco:  q2-3 minutes   A/P   29 y.o. G2P0010 [redacted]w[redacted]d presents with PPROM / labor Admit to L&D  Anticipate SVD  FSR/ vtx/ GBS negative  Huntley Demedeiros GEFFEL Emmamae Mcnamara

## 2020-07-14 NOTE — MAU Note (Signed)
Pt reporting increasing rectal pressure and feeling need to have BM. Requesting more RN check cervix and for pain medication. Cervix unchanged from previous exam.

## 2020-07-14 NOTE — ED Provider Notes (Signed)
[facility] EMERGENCY DEPARTMENT Provider Note   CSN: [id number] Arrival date & time: 07/14/20  0057     History Chief Complaint  Patient presents with  . Rupture of membranes/Contractions: [redacted] weeks pregnant    Megan Mercado is a 29 y.o. female.  Patient is a G1P0, [redacted] weeks pregnant, who presents to the ED with a chief complaint of contractions and her water breaking about 2 hours ago.  She states that she is having contractions about 3 minutes apart.  She is followed by Dr. Tenny Craw.  She states that it feels like she needs to defecate, but doesn't feel like anything is coming out.  The history is provided by the patient. No language interpreter was used.       History reviewed. No pertinent past medical history.  There are no problems to display for this patient.   History reviewed. No pertinent surgical history.   OB History    Gravida  2   Para      Term      Preterm      AB      Living        SAB      TAB      Ectopic      Multiple      Live Births              No family history on file.  Social History   Tobacco Use  . Smoking status: Never Smoker  . Smokeless tobacco: Never Used  Substance Use Topics  . Alcohol use: Never  . Drug use: Never    Home Medications Prior to Admission medications   Not on File    Allergies    Patient has no known allergies.  Review of Systems   Review of Systems  All other systems reviewed and are negative.   Physical Exam Updated Vital Signs BP 136/86 (BP Location: Left Arm)   Pulse 94   Temp 98.2 F (36.8 C) (Oral)   Resp 20   Ht 5\' 2"  (1.575 m)   Wt 98 kg   SpO2 100%   BMI 39.52 kg/m   Physical Exam Vitals and nursing note reviewed.  Constitutional:      General: She is not in acute distress.    Appearance: She is well-developed.  HENT:     Head: Normocephalic and atraumatic.  Eyes:     Conjunctiva/sclera: Conjunctivae normal.  Cardiovascular:     Rate  and Rhythm: Normal rate.     Heart sounds: No murmur heard.   Pulmonary:     Effort: Pulmonary effort is normal. No respiratory distress.  Abdominal:     General: There is no distension.     Tenderness: There is no abdominal tenderness.     Comments: gravid  Genitourinary:    Comments: Deferred to OB Musculoskeletal:     Cervical back: Neck supple.     Comments: Moves all extremities  Skin:    General: Skin is warm and dry.  Neurological:     Mental Status: She is alert and oriented to person, place, and time.  Psychiatric:        Mood and Affect: Mood normal.        Behavior: Behavior normal.     ED Results / Procedures / Treatments   Labs (all labs ordered are listed, but only abnormal results are displayed) Labs Reviewed - No data to display  EKG None  Radiology No  results found.  Procedures Procedures (including critical care time)  Medications Ordered in ED Medications - No data to display  ED Course  I have reviewed the triage vital signs and the nursing notes.  Pertinent labs & imaging results that were available during my care of the patient were reviewed by me and considered in my medical decision making (see chart for details).    MDM Rules/Calculators/A&P                          Patient presenting after her water broke about 2 hours ago.  Having regular contractions about 3 minutes apart.  She does NOT look like delivery is imminent and I believe her stable for transfer to the MAU.  I discussed the case with Denny Peon, CNM at MAU, who accepts in transfer.   Final Clinical Impression(s) / ED Diagnoses Final diagnoses:  Preterm premature rupture of membranes in third trimester, unspecified duration to onset of labor    Rx / DC Orders ED Discharge Orders    None       Roxy Horseman, PA-C 07/14/20 0112    Charlynne Pander, MD 07/14/20 [phone]

## 2020-07-14 NOTE — Anesthesia Postprocedure Evaluation (Signed)
Anesthesia Post Note  Patient: Megan Mercado  Procedure(s) Performed: AN AD HOC LABOR EPIDURAL     Patient location during evaluation: Mother Baby Anesthesia Type: Epidural Level of consciousness: awake and alert and oriented Pain management: satisfactory to patient Vital Signs Assessment: post-procedure vital signs reviewed and stable Respiratory status: respiratory function stable Cardiovascular status: stable Postop Assessment: no headache, no backache, epidural receding, patient able to bend at knees, no signs of nausea or vomiting, adequate PO intake and able to ambulate Anesthetic complications: no   No complications documented.  Last Vitals:  Vitals:   07/14/20 0830 07/14/20 0945  BP: 123/79 107/74  Pulse: 91 85  Resp: 16 18  Temp: 36.6 C 36.8 C  SpO2: 99% 99%    Last Pain:  Vitals:   07/14/20 1400  TempSrc:   PainSc: 0-No pain   Pain Goal:                   [name]

## 2020-07-14 NOTE — Lactation Note (Addendum)
This note was copied from a baby's chart. Lactation Consultation Note  Patient Name: Megan Mercado Date: 07/14/2020 Reason for consult: Follow-up assessment;1st time breastfeeding;Difficult latch;Late-preterm 34-36.6wks  Baby is 10 hours old and RN called due to baby having challenges with low blood sugars , ( 59,56,38 )  LC visited mom and 1st worked on hand expressing with no EBM yield.  Mom has erect nipples bilaterally,areola on the right has edema semi compressible and the left some edema amore compressible.  Baby awake and rooting, LC attempted several times and baby would not  Sustain a latch or depth for more than 5-7 good strong sucks and release.  Lab came to draw the blood sugar and then worked on latch with 5 FSNS and  Baby latched for 5 mins and took 2 ml.  Finished the feeding with finger feeding with 12F SNS and baby took 10 ml well .  Baby STS with mom.  The RN aware the DEBP needs to be set up for mom to consistently pump  To bring the milk in until the baby is latching better. Mom aware of the reason for pumping.  [facility] to the [facility].   LC di not start shells due to mom only having a tight sports with her and the shells would be to snug.     Maternal Data Has patient been taught Hand Expression?: Yes  Feeding Feeding Type: Formula Nipple Type: Slow - flow  LATCH Score Latch: Repeated attempts needed to sustain latch, nipple held in mouth throughout feeding, stimulation needed to elicit sucking reflex.  Audible Swallowing: A few with stimulation  Type of Nipple: Everted at rest and after stimulation  Comfort (Breast/Nipple): Soft / non-tender  Hold (Positioning): Assistance needed to correctly position infant at breast and maintain latch.  LATCH Score: 7  Interventions Interventions: Breast feeding basics reviewed  Lactation Tools Discussed/Used Tools: Shells;Pump (RN aware to set the DEBP up) Breast pump type: Double-Electric  Breast Pump   Consult Status Consult Status: Follow-up Date: 07/15/20 Follow-up type: In-patient    Megan Mercado 07/14/2020, 6:43 PM

## 2020-07-14 NOTE — MAU Note (Signed)
Pt here with reports of contractions since 1030pm, every 2-3 mins. Reports she had some leaking clear fluid that went through seat of car around 11:30pm. No vaginal bleeding. Reports good fetal movement. Cervix was 1cm on last exam.

## 2020-07-15 ENCOUNTER — Encounter (HOSPITAL_COMMUNITY): Payer: Self-pay | Admitting: Obstetrics

## 2020-07-15 LAB — CBC
HCT: 31 % — ABNORMAL LOW (ref 36.0–46.0)
Hemoglobin: 10 g/dL — ABNORMAL LOW (ref 12.0–15.0)
MCH: 26 pg (ref 26.0–34.0)
MCHC: 32.3 g/dL (ref 30.0–36.0)
MCV: 80.5 fL (ref 80.0–100.0)
Platelets: 309 10*3/uL (ref 150–400)
RBC: 3.85 MIL/uL — ABNORMAL LOW (ref 3.87–5.11)
RDW: 14.6 % (ref 11.5–15.5)
WBC: 12.6 10*3/uL — ABNORMAL HIGH (ref 4.0–10.5)
nRBC: 0 % (ref 0.0–0.2)

## 2020-07-15 NOTE — Discharge Summary (Signed)
Postpartum Discharge Summary  Date of Service updated     Patient Name: Megan Mercado DOB: 1991/06/07 MRN: [id number]  Date of admission: 07/14/2020 Delivery date:07/14/2020  Delivering provider: Jerelyn Charles  Date of discharge: 07/15/2020  Admitting diagnosis: Preterm premature rupture of membranes in third trimester, unspecified duration to onset of labor [O42.913] Preterm premature rupture of membranes (PPROM) with onset of labor within 24 hours of rupture in third trimester, antepartum [O42.013] Intrauterine pregnancy: [redacted]w[redacted]d    Secondary diagnosis:  Active Problems:   Preterm premature rupture of membranes (PPROM) with onset of labor within 24 hours of rupture in third trimester, antepartum  Additional problems: none    Discharge diagnosis: Preterm Pregnancy Delivered                                              Post partum procedures:none Augmentation: N/A Complications: None  Hospital course: Onset of Labor With Vaginal Delivery      29y.o. yo G2P0111 at [id number] admitted in Active Labor on 07/14/2020. Patient had an uncomplicated labor course as follows:  Membrane Rupture Time/Date: 11:30 PM ,07/13/2020   Delivery Method:Vaginal, Spontaneous  Episiotomy: None  Lacerations:  Labial  Patient had an uncomplicated postpartum course.  She is ambulating, tolerating a regular diet, passing flatus, and urinating well. Patient is discharged home in stable condition on 07/15/20.  Newborn Data: Birth date:07/14/2020  Birth time:6:47 AM  Gender:Female  Living status:Living  Apgars:9 ,9  Weight:2931 g   Magnesium Sulfate received: No BMZ received: No Rhophylac:No MMR:No T-DaP:Given prenatally Flu: No Transfusion:No  Physical exam  Vitals:   07/14/20 0945 07/14/20 1800 07/14/20 2151 07/15/20 0534  BP: 107/74 106/66 112/71 104/73  Pulse: 85 93 95 83  Resp: _0 Temp: 98.3 F (36.8 C) 98.1 F (36.7 C) 98.2 F (36.8 C) 98.2 F (36.8 C)  TempSrc: Oral Oral  Oral Oral  SpO2: 99% 99% 98% 98%  Weight:      Height:       Labs: Lab Results  Component Value Date   WBC 12.6 (H) 07/15/2020   HGB 10.0 (L) 07/15/2020   HCT 31.0 (L) 07/15/2020   MCV 80.5 07/15/2020   PLT 309 07/15/2020   No flowsheet data found. Edinburgh Score: No flowsheet data found.    After visit meds:  Allergies as of 07/15/2020      Reactions   Augmentin [amoxicillin-pot Clavulanate] Itching      Medication List    STOP taking these medications   aspirin EC 81 MG tablet     TAKE these medications   PRENATAL PO Take 1 tablet by mouth daily.            Discharge Care Instructions  (From admission, onward)         Start     Ordered   07/15/20 0000  Discharge wound care:       Comments: Sitz baths and icepacks to perineum.  If stitches, they will dissolve.   07/15/20 0940           Discharge home in stable condition Infant Feeding: ? Infant Disposition:home with mother Discharge instruction: per After Visit Summary and Postpartum booklet. Activity: Advance as tolerated. Pelvic rest for 6 weeks.  Diet: routine diet Anticipated Birth Control: Unsure Postpartum Appointment:4 weeks Additional Postpartum F/U: none Future  Appointments:No future appointments. Follow up Visit:  Follow-up Information    Jerelyn Charles, MD Follow up in 4 week(s).   Specialty: Obstetrics Contact information: [address] [place] [address] [phone]                   07/15/2020 Daria Pastures, MD

## 2020-07-15 NOTE — Progress Notes (Signed)
Patient is eating, ambulating, voiding.  Pain control is good.  Vitals:   07/14/20 0945 07/14/20 1800 07/14/20 2151 07/15/20 0534  BP: 107/74 106/66 112/71 104/73  Pulse: 85 93 95 83  Resp: 18 19 18 17   Temp: 98.3 F (36.8 C) 98.1 F (36.7 C) 98.2 F (36.8 C) 98.2 F (36.8 C)  TempSrc: Oral Oral Oral Oral  SpO2: 99% 99% 98% 98%  Weight:      Height:        Fundus firm Perineum without swelling.  Lab Results  Component Value Date   WBC 12.6 (H) 07/15/2020   HGB 10.0 (L) 07/15/2020   HCT 31.0 (L) 07/15/2020   MCV 80.5 07/15/2020   PLT 309 07/15/2020    --/--/O POS (08/03 0211)/RI  A/P Post partum day 1.  Routine care.  Expect d/c routine.    12-14-1997

## 2020-07-16 ENCOUNTER — Ambulatory Visit: Payer: Self-pay

## 2020-07-16 NOTE — Lactation Note (Signed)
This note was copied from a baby's chart. Lactation Consultation Note  Patient Name: Girl Saanvi Hakala [id number] Date: 07/16/2020 Reason for consult: Follow-up assessment   Infant is 56 hours old.mother reports that she has used her electric pump twice recently and she observed clear drops of colostrum.  Mother reports that she has been latching infant to the breast after she bottle feeds infant. She reports that she plans to purchase an electric pump today upon discharge.  Mother was given a harmony hand pump with instructions.  Lots of teaching on supply and demand and the importance of pumping her breast consistently at least every 2-3 hours for 15-20 mins 6-8 times daily.  Discussed treatment and prevention of engorgement.   Breastfeed infant with feeding cues Supplement infant with ebm/formula, according to supplemental guidelines. Pump using a DEBP after each feeding for 15-20 mins.   Mother to continue to cue base feed infant and feed at least 8-12 times or more in 24 hours and advised to allow for cluster feeding infant as needed.   Mother to continue to due STS. Mother is aware of available LC services at [facility], BFSG'S, OP Dept, and phone # for questions or concerns about breastfeeding.  Mother receptive to all teaching and plan of care.     Maternal Data    Feeding    LATCH Score                   Interventions Interventions: Hand express;Pre-pump if needed;Hand pump;DEBP  Lactation Tools Discussed/Used     Consult Status      Michel Bickers 07/16/2020, 8:55 AM

## 2022-03-24 ENCOUNTER — Encounter (HOSPITAL_COMMUNITY): Payer: Self-pay

## 2022-03-24 ENCOUNTER — Ambulatory Visit (HOSPITAL_COMMUNITY)
Admission: RE | Admit: 2022-03-24 | Discharge: 2022-03-24 | Disposition: A | Discharge status: Home / Self Care | Payer: BC Managed Care – PPO | Source: Ambulatory Visit | Attending: Nurse Practitioner | Admitting: Nurse Practitioner

## 2022-03-24 VITALS — BP 125/80 | HR 112 | Temp 99.5°F | Resp 16

## 2022-03-24 DIAGNOSIS — B084 Enteroviral vesicular stomatitis with exanthem: Secondary | ICD-10-CM | POA: Diagnosis not present

## 2022-03-24 DIAGNOSIS — J029 Acute pharyngitis, unspecified: Secondary | ICD-10-CM | POA: Diagnosis present

## 2022-03-24 LAB — POCT RAPID STREP A, ED / UC: Streptococcus, Group A Screen (Direct): NEGATIVE

## 2022-03-24 MED ORDER — METHYLPREDNISOLONE SODIUM SUCC 125 MG IJ SOLR
INTRAMUSCULAR | Status: AC
  Filled 2022-03-24: qty 2

## 2022-03-24 MED ORDER — AZITHROMYCIN 500 MG PO TABS: 500.0000 mg | ORAL_TABLET | Freq: Every day | ORAL | 0 refills | Status: AC

## 2022-03-24 MED ORDER — METHYLPREDNISOLONE SODIUM SUCC 125 MG IJ SOLR
80.0000 mg | Freq: Once | INTRAMUSCULAR | Status: AC
  Administered 2022-03-24: 80 mg via INTRAMUSCULAR

## 2022-03-24 NOTE — ED Provider Notes (Addendum)
?[facility] ? ? ? ?CSN: [id number] ?Arrival date & time: 03/24/22  0915 ? ? ?  ? ?History   ?Chief Complaint ?Chief Complaint  ?Patient presents with  ? Rash  ? Sore Throat  ? Fever  ? ? ?HPI ?Megan Mercado is a 31 y.o. female.  ? ?Patient presents with a few days of fever, decreased energey, and sore throat.  She reports it is painful and difficult to swallow, her throat is sore and swollen.  She denies any cough, congestion, shortness of breath, chest pain, nausea/vomiting.  She has had some diarrhea and has not been eating and drinking as much as normal.  This morning, she woke up this morning with rash on palms, feet, around face.  No shortness of breath, tongue swelling.  She reports her daughter was recently diagnosed with hand-foot-and-mouth. ? ? ?Past Medical History:  ?Diagnosis Date  ? Medical history non-contributory   ? ? ?Patient Active Problem List  ? Diagnosis Date Noted  ? Preterm premature rupture of membranes (PPROM) with onset of labor within 24 hours of rupture in third trimester, antepartum 07/14/2020  ? ? ?History reviewed. No pertinent surgical history. ? ?OB History   ? ? Gravida  ?2  ? Para  ?1  ? Term  ?   ? Preterm  ?1  ? AB  ?1  ? Living  ?1  ?  ? ? SAB  ?1  ? IAB  ?   ? Ectopic  ?   ? Multiple  ?0  ? Live Births  ?1  ?   ?  ?  ? ? ? ?Home Medications   ? ?Prior to Admission medications   ?Medication Sig Start Date End Date Taking? Authorizing Provider  ?azithromycin (ZITHROMAX) 500 MG tablet Take 1 tablet (500 mg total) by mouth daily for 5 days. 03/24/22 03/29/22 Yes Valentino Nose, NP  ?Prenatal Vit-Fe Fumarate-FA (PRENATAL PO) Take 1 tablet by mouth daily.    [provider]  ? ? ?Family History ?Family History  ?Problem Relation Age of Onset  ? Diabetes Father   ? Diabetes Maternal Grandfather   ? ? ?Social History ?Social History  ? ?Tobacco Use  ? Smoking status: Never  ? Smokeless tobacco: Never  ?Vaping Use  ? Vaping Use: Never used  ?Substance Use  Topics  ? Alcohol use: Never  ? Drug use: Never  ? ? ? ?Allergies   ?Augmentin [amoxicillin-pot clavulanate] ? ? ?Review of Systems ?Review of Systems ?Per HPI ? ?Physical Exam ?Triage Vital Signs ?ED Triage Vitals  ?Enc Vitals Group  ?   BP 03/24/22 0954 125/80  ?   Pulse Rate 03/24/22 0954 (!) 112  ?   Resp 03/24/22 0954 16  ?   Temp 03/24/22 0955 99.5 ?F (37.5 ?C)  ?   Temp Source 03/24/22 0955 Oral  ?   SpO2 03/24/22 0954 100 %  ?   Weight --   ?   Height --   ?   Head Circumference --   ?   Peak Flow --   ?   Pain Score 03/24/22 0953 3  ?   Pain Loc --   ?   Pain Edu? --   ?   Excl. in GC? --   ? ?No data found. ? ?Updated Vital Signs ?BP 125/80 (BP Location: Left Arm)   Pulse (!) 112   Temp 99.5 ?F (37.5 ?C) (Oral)   Resp 16   LMP 02/25/2022 (  Exact Date)   SpO2 100%  ? ?Visual Acuity ?Right Eye Distance:   ?Left Eye Distance:   ?Bilateral Distance:   ? ?Right Eye Near:   ?Left Eye Near:    ?Bilateral Near:    ? ?Physical Exam ?Vitals and nursing note reviewed.  ?Constitutional:   ?   General: She is not in acute distress. ?   Appearance: She is well-developed. She is not toxic-appearing.  ?HENT:  ?   Head: Normocephalic and atraumatic.  ?   Right Ear: Tympanic membrane and ear canal normal. No drainage, swelling or tenderness. No middle ear effusion. Tympanic membrane is not erythematous.  ?   Left Ear: Tympanic membrane and ear canal normal. No drainage, swelling or tenderness.  No middle ear effusion. Tympanic membrane is not erythematous.  ?   Nose: No congestion or rhinorrhea.  ?   Mouth/Throat:  ?   Mouth: Mucous membranes are moist.  ?   Pharynx: Oropharynx is clear. Uvula midline. Posterior oropharyngeal erythema present. No oropharyngeal exudate.  ?   Tonsils: Tonsillar exudate present. 1+ on the right. 1+ on the left.  ?Eyes:  ?   Extraocular Movements:  ?   Right eye: Normal extraocular motion.  ?   Left eye: Normal extraocular motion.  ?Cardiovascular:  ?   Rate and Rhythm: Normal rate and  regular rhythm.  ?Pulmonary:  ?   Effort: Pulmonary effort is normal. No respiratory distress.  ?   Breath sounds: Normal breath sounds. No wheezing, rhonchi or rales.  ?Musculoskeletal:  ?   Cervical back: Normal range of motion and neck supple.  ?Lymphadenopathy:  ?   Cervical: No cervical adenopathy.  ?Skin: ?   General: Skin is warm and dry.  ?   Capillary Refill: Capillary refill takes less than 2 seconds.  ?   Coloration: Skin is not pale.  ?   Findings: Erythema and rash present. Rash is papular.  ?   Comments: Papular, raised, slightly erythematous rash to palms and face  ?Neurological:  ?   Mental Status: She is alert and oriented to person, place, and time.  ?Psychiatric:     ?   Behavior: Behavior is cooperative.  ? ? ? ?UC Treatments / Results  ?Labs ?(all labs ordered are listed, but only abnormal results are displayed) ?Labs Reviewed  ?CULTURE, GROUP A STREP [facility]  ?POCT RAPID STREP A, ED / UC  ? ? ?EKG ? ? ?Radiology ?No results found. ? ?Procedures ?Procedures (including critical care time) ? ?Medications Ordered in UC ?Medications  ?methylPREDNISolone sodium succinate (SOLU-MEDROL) 125 mg/2 mL injection 80 mg (80 mg Intramuscular Given 03/24/22 1033)  ? ? ?Initial Impression / Assessment and Plan / UC Course  ?I have reviewed the triage vital signs and the nursing notes. ? ?Pertinent labs & imaging results that were available during my care of the patient were reviewed by me and considered in my medical decision making (see chart for details). ? ?  ?Rapid strep test today is negative.  Send for throat culture and treat clinically based on examination and symptoms.  Allergy to penicillin, will use Azithromycin 500 mg daily for 5 days.  Encouraged changing toothbrush after treatment.  Treat tonsillar edema and inflammation from hand, foot, mouth rash with Solu-Medrol 80 mg IM today in urgent care.  Start daily oral antihistamine in the morning and can use Benadryl 25-50 mg nightly for itch if itching  persists.   Note given for work.  Seek care if  symptoms do not improve or if they worsen. ?Final Clinical Impressions(s) / UC Diagnoses  ? ?Final diagnoses:  ?Hand, foot and mouth disease  ?Acute pharyngitis, unspecified etiology  ? ? ? ?Discharge Instructions   ? ?  ?- We have given you a steroid shot today to help with the inflammation in your throat and the itching  ?- Please start on a daily antihistamine (Zyrtec, Allegra, Claritin).  You can take Benadryl at night time to help with the itching if needed also.  ?- The rapid strep throat test today is negative.  We are sending the sample off for a throat culture to check it more in detail.  Please start on the antibiotics in the meantime; clinically I think you may have strep throat still ?- Please change you toothbrush after you finish treatment with antibiotics.  You are considered no longer contagious after you have been on antibiotics for 24 hours.  ? ? ? ? ?ED Prescriptions   ? ? Medication Sig Dispense Auth. Provider  ? azithromycin (ZITHROMAX) 500 MG tablet Take 1 tablet (500 mg total) by mouth daily for 5 days. 5 tablet Valentino Nose, NP  ? ?  ? ?PDMP not reviewed this encounter. ?  ?Valentino Nose, NP ?03/24/22 1057 ? ?  ?Valentino Nose, NP ?03/24/22 1110 ? ?

## 2022-03-24 NOTE — Discharge Instructions (Signed)
-   We have given you a steroid shot today to help with the inflammation in your throat and the itching  ?- Please start on a daily antihistamine (Zyrtec, Allegra, Claritin).  You can take Benadryl at night time to help with the itching if needed also.  ?- The rapid strep throat test today is negative.  We are sending the sample off for a throat culture to check it more in detail.  Please start on the antibiotics in the meantime; clinically I think you may have strep throat still ?- Please change you toothbrush after you finish treatment with antibiotics.  You are considered no longer contagious after you have been on antibiotics for 24 hours.  ?

## 2022-03-24 NOTE — ED Triage Notes (Signed)
Pt presents with c/o a rash and sore throat.  ? ?Pt states her daughter has hand foot and mouth. Pt states she has a rash now and states she has a sore throat since Monday.  ? ?Pt states she is very itchy.  ?

## 2022-03-26 LAB — CULTURE, GROUP A STREP (THRC)

## 2022-05-05 ENCOUNTER — Ambulatory Visit (HOSPITAL_COMMUNITY)
Admission: EM | Admit: 2022-05-05 | Discharge: 2022-05-05 | Disposition: A | Discharge status: Home / Self Care | Payer: BC Managed Care – PPO

## 2022-05-05 ENCOUNTER — Encounter (HOSPITAL_COMMUNITY): Payer: Self-pay | Admitting: Emergency Medicine

## 2022-05-05 DIAGNOSIS — L608 Other nail disorders: Secondary | ICD-10-CM | POA: Diagnosis not present

## 2022-05-05 NOTE — Discharge Instructions (Signed)
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  There is no treatment for this.  It will just have to run its course.

## 2022-05-05 NOTE — ED Provider Notes (Signed)
[facility]    CSN: [id number] Arrival date & time: 05/05/22  1801      History   Chief Complaint Chief Complaint  Patient presents with   finger problem    HPI Megan Mercado is a 31 y.o. female.   Patient presents with complaints of her fingernails and toenails shedding over the past few weeks after being diagnosed with hand, foot, mouth disease a little over a month ago.  Denies any purulent drainage, swelling, fever associated with this.  Denies any pain associated.    Past Medical History:  Diagnosis Date   Medical history non-contributory     Patient Active Problem List   Diagnosis Date Noted   Preterm premature rupture of membranes (PPROM) with onset of labor within 24 hours of rupture in third trimester, antepartum 07/14/2020    History reviewed. No pertinent surgical history.  OB History     Gravida  2   Para  1   Term      Preterm  1   AB  1   Living  1      SAB  1   IAB      Ectopic      Multiple  0   Live Births  1            Home Medications    Prior to Admission medications   Medication Sig Start Date End Date Taking? Authorizing Provider  Prenatal Vit-Fe Fumarate-FA (PRENATAL PO) Take 1 tablet by mouth daily.    [provider]    Family History Family History  Problem Relation Age of Onset   Diabetes Father    Diabetes Maternal Grandfather     Social History Social History   Tobacco Use   Smoking status: Never   Smokeless tobacco: Never  Vaping Use   Vaping Use: Never used  Substance Use Topics   Alcohol use: Never   Drug use: Never     Allergies   Augmentin [amoxicillin-pot clavulanate]   Review of Systems Review of Systems Per HPI  Physical Exam Triage Vital Signs ED Triage Vitals  Enc Vitals Group     BP 05/05/22 1901 126/79     Pulse Rate 05/05/22 1901 87     Resp 05/05/22 1901 17     Temp 05/05/22 1901 98 F (36.7 C)     Temp Source 05/05/22 1901 Oral     SpO2  05/05/22 1901 99 %     Weight 05/05/22 1859 216 lb 0.8 oz (98 kg)     Height 05/05/22 1859 5\' 2"  (1.575 m)     Head Circumference --      Peak Flow --      Pain Score 05/05/22 1859 0     Pain Loc --      Pain Edu? --      Excl. in GC? --    No data found.  Updated Vital Signs BP 126/79 (BP Location: Left Arm)   Pulse 87   Temp 98 F (36.7 C) (Oral)   Resp 17   Ht 5\' 2"  (1.575 m)   Wt 216 lb 0.8 oz (98 kg)   SpO2 99%   BMI 39.52 kg/m   Visual Acuity Right Eye Distance:   Left Eye Distance:   Bilateral Distance:    Right Eye Near:   Left Eye Near:    Bilateral Near:     Physical Exam Constitutional:      General: She is  not in acute distress.    Appearance: Normal appearance. She is not toxic-appearing or diaphoretic.  HENT:     Head: Normocephalic and atraumatic.  Eyes:     Extraocular Movements: Extraocular movements intact.     Conjunctiva/sclera: Conjunctivae normal.  Pulmonary:     Effort: Pulmonary effort is normal.  Skin:    Comments: Brittle appearing nails to first 3 digits of bilateral hands.  Brittle nails throughout entirety of bilateral toenails.  No obvious discoloration, erythema, swelling, purulent drainage.  Nails are intact.  Neurological:     General: No focal deficit present.     Mental Status: She is alert and oriented to person, place, and time. Mental status is at baseline.  Psychiatric:        Mood and Affect: Mood normal.        Behavior: Behavior normal.        Thought Content: Thought content normal.        Judgment: Judgment normal.     UC Treatments / Results  Labs (all labs ordered are listed, but only abnormal results are displayed) Labs Reviewed - No data to display  EKG   Radiology No results found.  Procedures Procedures (including critical care time)  Medications Ordered in UC Medications - No data to display  Initial Impression / Assessment and Plan / UC Course  I have reviewed the triage vital signs and the  nursing notes.  Pertinent labs & imaging results that were available during my care of the patient were reviewed by me and considered in my medical decision making (see chart for details).     It appears the patient may have onychomadesis which is a late complication of hand, foot, mouth disease.  Advised patient that it will have to run its course and there is no obvious treatment for it.  Patient was given strict return precautions.  Patient verbalized understanding and was agreeable with plan. 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  There is no treatment for this.  It will just have to run its course.    ED Prescriptions   None    PDMP not reviewed this encounter.   Gustavus Bryant, [place] 05/05/22 [phone]

## 2022-05-05 NOTE — ED Triage Notes (Signed)
Pt reports finger nails and toe nails coming off after being dx with hand, foot and mouth disease a month ago. States her nails are lifting and toe nails are coming off.

## 2022-10-28 ENCOUNTER — Encounter (HOSPITAL_COMMUNITY): Payer: Self-pay

## 2022-10-28 ENCOUNTER — Ambulatory Visit (HOSPITAL_COMMUNITY)
Admission: RE | Admit: 2022-10-28 | Discharge: 2022-10-28 | Disposition: A | Discharge status: Home / Self Care | Payer: BC Managed Care – PPO | Source: Ambulatory Visit | Attending: Family Medicine | Admitting: Family Medicine

## 2022-10-28 VITALS — BP 107/79 | HR 85 | Temp 98.1°F | Resp 17

## 2022-10-28 DIAGNOSIS — R202 Paresthesia of skin: Secondary | ICD-10-CM

## 2022-10-28 DIAGNOSIS — R21 Rash and other nonspecific skin eruption: Secondary | ICD-10-CM | POA: Diagnosis present

## 2022-10-28 DIAGNOSIS — R200 Anesthesia of skin: Secondary | ICD-10-CM | POA: Insufficient documentation

## 2022-10-28 LAB — CBC WITH DIFFERENTIAL/PLATELET
Abs Immature Granulocytes: 0 10*3/uL (ref 0.00–0.07)
Basophils Absolute: 0 10*3/uL (ref 0.0–0.1)
Basophils Relative: 0 %
Eosinophils Absolute: 0.2 10*3/uL (ref 0.0–0.5)
Eosinophils Relative: 3 %
HCT: 33.7 % — ABNORMAL LOW (ref 36.0–46.0)
Hemoglobin: 10.9 g/dL — ABNORMAL LOW (ref 12.0–15.0)
Lymphocytes Relative: 30 %
Lymphs Abs: 2.1 10*3/uL (ref 0.7–4.0)
MCH: 24 pg — ABNORMAL LOW (ref 26.0–34.0)
MCHC: 32.3 g/dL (ref 30.0–36.0)
MCV: 74.1 fL — ABNORMAL LOW (ref 80.0–100.0)
Monocytes Absolute: 0.1 10*3/uL (ref 0.1–1.0)
Monocytes Relative: 2 %
Neutro Abs: 4.6 10*3/uL (ref 1.7–7.7)
Neutrophils Relative %: 65 %
Platelets: 365 10*3/uL (ref 150–400)
RBC: 4.55 MIL/uL (ref 3.87–5.11)
RDW: 16.1 % — ABNORMAL HIGH (ref 11.5–15.5)
WBC: 7.1 10*3/uL (ref 4.0–10.5)
nRBC: 0 % (ref 0.0–0.2)
nRBC: 0 /100 WBC

## 2022-10-28 LAB — VITAMIN B12: Vitamin B-12: 455 pg/mL (ref 180–914)

## 2022-10-28 LAB — COMPREHENSIVE METABOLIC PANEL
ALT: 16 U/L (ref 0–44)
AST: 16 U/L (ref 15–41)
Albumin: 3.5 g/dL (ref 3.5–5.0)
Alkaline Phosphatase: 41 U/L (ref 38–126)
Anion gap: 11 (ref 5–15)
BUN: 7 mg/dL (ref 6–20)
CO2: 25 mmol/L (ref 22–32)
Calcium: 8.7 mg/dL — ABNORMAL LOW (ref 8.9–10.3)
Chloride: 103 mmol/L (ref 98–111)
Creatinine, Ser: 0.9 mg/dL (ref 0.44–1.00)
GFR, Estimated: >60 mL/min (ref >60)
Glucose, Bld: 75 mg/dL (ref 70–99)
Potassium: 3.7 mmol/L (ref 3.5–5.1)
Sodium: 139 mmol/L (ref 135–145)
Total Bilirubin: 0.4 mg/dL (ref 0.3–1.2)
Total Protein: 7.2 g/dL (ref 6.5–8.1)

## 2022-10-28 NOTE — ED Triage Notes (Signed)
Pt reports that having intermittent tingling in hands and feet for 3 days. Noticed red spots on hands as well. Noticed red line on one of toes either Wed or Thursday.

## 2022-10-28 NOTE — Discharge Instructions (Addendum)
You were seen today for tingling in hands and feet, along with rash on the hands/feet.  As discussed, this could be hand/foot/mouth, dyshidrotic eczema, or another cause.  I have ordered blood work for further evaluation.  This will be resulted later today or tomorrow.  You can see this on my chart and we will call if there is anything abnormal.  In the meantime I recommend monitoring your symptoms.  If anything should change/worsen then please return for re-evaluation.

## 2022-10-28 NOTE — ED Provider Notes (Signed)
[facility]    CSN: [id number] Arrival date & time: 10/28/22  1254      History   Chief Complaint Chief Complaint  Patient presents with   appt 1p   Tingling    HPI Megan Mercado is a 31 y.o. female.   Patient is here for bilateral hand and bilateral feet tingling.  Has been going on x 3 days.  The tingling comes/goes.  Does not last all day.  She did have hand/foot/mouth in April and not sure if this is the same.  She did have a sore throat earlier this week, but that has resolved.  She has noted a rash/breaking out along the finger/thumbs and on her feet.   She noted a red line across one of the toes on her foot as well.  She did have chills with vomiting on Monday, but that resolved.    Past Medical History:  Diagnosis Date   Medical history non-contributory     Patient Active Problem List   Diagnosis Date Noted   Preterm premature rupture of membranes (PPROM) with onset of labor within 24 hours of rupture in third trimester, antepartum 07/14/2020    History reviewed. No pertinent surgical history.  OB History     Gravida  2   Para  1   Term      Preterm  1   AB  1   Living  1      SAB  1   IAB      Ectopic      Multiple  0   Live Births  1            Home Medications    Prior to Admission medications   Medication Sig Start Date End Date Taking? Authorizing Provider  Prenatal Vit-Fe Fumarate-FA (PRENATAL PO) Take 1 tablet by mouth daily.    [provider]    Family History Family History  Problem Relation Age of Onset   Diabetes Father    Diabetes Maternal Grandfather     Social History Social History   Tobacco Use   Smoking status: Never   Smokeless tobacco: Never  Vaping Use   Vaping Use: Never used  Substance Use Topics   Alcohol use: Never   Drug use: Never     Allergies   Augmentin [amoxicillin-pot clavulanate]   Review of Systems Review of Systems  Constitutional: Negative.    HENT: Negative.    Respiratory: Negative.    Cardiovascular: Negative.   Gastrointestinal: Negative.   Skin:  Positive for rash.  Neurological:  Positive for numbness.  Psychiatric/Behavioral: Negative.       Physical Exam Triage Vital Signs ED Triage Vitals [10/28/22 1319]  Enc Vitals Group     BP 107/79     Pulse Rate 85     Resp 17     Temp 98.1 F (36.7 C)     Temp Source Oral     SpO2 99 %     Weight      Height      Head Circumference      Peak Flow      Pain Score 0     Pain Loc      Pain Edu?      Excl. in GC?    No data found.  Updated Vital Signs BP 107/79 (BP Location: Left Arm)   Pulse 85   Temp 98.1 F (36.7 C) (Oral)   Resp 17  LMP 10/09/2022   SpO2 99%   Visual Acuity Right Eye Distance:   Left Eye Distance:   Bilateral Distance:    Right Eye Near:   Left Eye Near:    Bilateral Near:     Physical Exam Constitutional:      Appearance: Normal appearance.  Cardiovascular:     Pulses: Normal pulses.  Pulmonary:     Effort: Pulmonary effort is normal.  Abdominal:     General: Abdomen is flat.  Musculoskeletal:        General: Tenderness present. No swelling. Normal range of motion.  Skin:    Comments: Small red dots noted at the pads of the fingers;  small area of dots at the left palm, and along the thumbs;  Small red dots noted along the medial arches of the feet bilaterally  Neurological:     General: No focal deficit present.     Mental Status: She is alert.     Motor: Motor function is intact.     Coordination: Coordination is intact.     Comments: Phalen and tinels negative bilaterally;  normal strength  Psychiatric:        Mood and Affect: Mood normal.      UC Treatments / Results  Labs (all labs ordered are listed, but only abnormal results are displayed) Labs Reviewed  COMPREHENSIVE METABOLIC PANEL  CBC WITH DIFFERENTIAL/PLATELET  VITAMIN B12    EKG   Radiology No results found.  Procedures Procedures  (including critical care time)  Medications Ordered in UC Medications - No data to display  Initial Impression / Assessment and Plan / UC Course  I have reviewed the triage vital signs and the nursing notes.  Pertinent labs & imaging results that were available during my care of the patient were reviewed by me and considered in my medical decision making (see chart for details).    Final Clinical Impressions(s) / UC Diagnoses   Final diagnoses:  Numbness and tingling in both hands  Tingling of both feet  Rash and nonspecific skin eruption     Discharge Instructions      You were seen today for tingling in hands and feet, along with rash on the hands/feet.  As discussed, this could be hand/foot/mouth, dyshidrotic eczema, or another cause.  I have ordered blood work for further evaluation.  This will be resulted later today or tomorrow.  You can see this on my chart and we will call if there is anything abnormal.  In the meantime I recommend monitoring your symptoms.  If anything should change/worsen then please return for re-evaluation.     ED Prescriptions   None    PDMP not reviewed this encounter.   Jannifer Franklin, MD 10/28/22 1410

## 2023-02-13 ENCOUNTER — Ambulatory Visit
Admission: RE | Admit: 2023-02-13 | Discharge: 2023-02-13 | Disposition: A | Discharge status: Home / Self Care | Payer: BC Managed Care – PPO | Source: Ambulatory Visit | Attending: Nurse Practitioner | Admitting: Nurse Practitioner

## 2023-02-13 VITALS — BP 125/79 | HR 85 | Temp 98.6°F | Resp 18

## 2023-02-13 DIAGNOSIS — Z113 Encounter for screening for infections with a predominantly sexual mode of transmission: Secondary | ICD-10-CM | POA: Insufficient documentation

## 2023-02-13 DIAGNOSIS — N760 Acute vaginitis: Secondary | ICD-10-CM | POA: Insufficient documentation

## 2023-02-13 LAB — POCT URINALYSIS DIP (MANUAL ENTRY)
Bilirubin, UA: NEGATIVE
Glucose, UA: NEGATIVE mg/dL
Ketones, POC UA: NEGATIVE mg/dL
Nitrite, UA: NEGATIVE
Protein Ur, POC: NEGATIVE mg/dL
Spec Grav, UA: 1.02 (ref 1.010–1.025)
Urobilinogen, UA: 0.2 E.U./dL
pH, UA: 5.5 (ref 5.0–8.0)

## 2023-02-13 LAB — POCT URINE PREGNANCY: Preg Test, Ur: NEGATIVE

## 2023-02-13 NOTE — Discharge Instructions (Signed)
The clinical contact you with results of your urine culture and STD testing done today if positive Follow-up with your PCP if symptoms not improving Please go to the ER for any worsening symptoms

## 2023-02-13 NOTE — ED Triage Notes (Signed)
Pt presents with vaginal irritation and itching x 3 days. Pt requesting std testing. Denies any other symptoms.

## 2023-02-13 NOTE — ED Provider Notes (Signed)
[facility]    CSN: [id number] Arrival date & time: 02/13/23  1253      History   Chief Complaint Chief Complaint  Patient presents with   Exposure to STD    Std tested - Entered by patient    HPI Megan Mercado is a 32 y.o. female presents for evaluation of vaginal irritation for 3 days patient reports some irritation she states she just feels off.  Denies any vaginal discharge, dysuria, fevers, chills, nausea/vomiting, flank pain.  No known STD exposure but would like to be tested.  No OTC medications have been used.  No other concerns at this time.   Exposure to STD    Past Medical History:  Diagnosis Date   Medical history non-contributory     Patient Active Problem List   Diagnosis Date Noted   Preterm premature rupture of membranes (PPROM) with onset of labor within 24 hours of rupture in third trimester, antepartum 07/14/2020    History reviewed. No pertinent surgical history.  OB History     Gravida  2   Para  1   Term      Preterm  1   AB  1   Living  1      SAB  1   IAB      Ectopic      Multiple  0   Live Births  1            Home Medications    Prior to Admission medications   Not on File    Family History Family History  Problem Relation Age of Onset   Healthy Mother    Diabetes Father    Diabetes Maternal Grandfather     Social History Social History   Tobacco Use   Smoking status: Never   Smokeless tobacco: Never  Vaping Use   Vaping Use: Never used  Substance Use Topics   Alcohol use: Never   Drug use: Never     Allergies   Augmentin [amoxicillin-pot clavulanate]   Review of Systems Review of Systems  Genitourinary:        Vaginal irritation      Physical Exam Triage Vital Signs ED Triage Vitals  Enc Vitals Group     BP 02/13/23 1306 125/79     Pulse Rate 02/13/23 1306 85     Resp 02/13/23 1306 18     Temp 02/13/23 1306 98.6 F (37 C)     Temp src --      SpO2 02/13/23  1306 98 %     Weight --      Height --      Head Circumference --      Peak Flow --      Pain Score 02/13/23 1305 0     Pain Loc --      Pain Edu? --      Excl. in [place]? --    No data found.  Updated Vital Signs BP 125/79   Pulse 85   Temp 98.6 F (37 C)   Resp 18   LMP 02/06/2023 (Exact Date)   SpO2 98%   Breastfeeding No   Visual Acuity Right Eye Distance:   Left Eye Distance:   Bilateral Distance:    Right Eye Near:   Left Eye Near:    Bilateral Near:     Physical Exam Vitals and nursing note reviewed.  Constitutional:      Appearance: Normal appearance.  HENT:  Head: Normocephalic and atraumatic.  Eyes:     Pupils: Pupils are equal, round, and reactive to light.  Cardiovascular:     Rate and Rhythm: Normal rate.  Pulmonary:     Effort: Pulmonary effort is normal.  Abdominal:     Tenderness: There is no right CVA tenderness or left CVA tenderness.  Skin:    General: Skin is warm and dry.  Neurological:     General: No focal deficit present.     Mental Status: She is alert and oriented to person, place, and time.  Psychiatric:        Mood and Affect: Mood normal.        Behavior: Behavior normal.      UC Treatments / Results  Labs (all labs ordered are listed, but only abnormal results are displayed) Labs Reviewed  POCT URINALYSIS DIP (MANUAL ENTRY) - Abnormal; Notable for the following components:      Result Value   Color, UA light yellow (*)    Blood, UA trace-intact (*)    Leukocytes, UA Trace (*)    All other components within normal limits  URINE CULTURE  RPR  HIV ANTIBODY (ROUTINE TESTING W REFLEX)  POCT URINE PREGNANCY  CERVICOVAGINAL ANCILLARY ONLY    EKG   Radiology No results found.  Procedures Procedures (including critical care time)  Medications Ordered in UC Medications - No data to display  Initial Impression / Assessment and Plan / UC Course  I have reviewed the triage vital signs and the nursing  notes.  Pertinent labs & imaging results that were available during my care of the patient were reviewed by me and considered in my medical decision making (see chart for details).     Negative hCG UA with trace leuks and blood.  Will culture as patient is asymptomatic STD testing is ordered and will contact if positive Rest and fluids Follow-up with PCP for recheck ER precautions reviewed and patient verbalized understanding Final Clinical Impressions(s) / UC Diagnoses   Final diagnoses:  Acute vaginitis  Screening examination for STD (sexually transmitted disease)     Discharge Instructions      The clinical contact you with results of your urine culture and STD testing done today if positive Follow-up with your PCP if symptoms not improving Please go to the ER for any worsening symptoms     ED Prescriptions   None    PDMP not reviewed this encounter.   Melynda Ripple, NP 02/13/23 1416

## 2023-02-14 LAB — CERVICOVAGINAL ANCILLARY ONLY
Bacterial Vaginitis (gardnerella): POSITIVE — AB
Candida Glabrata: NEGATIVE
Candida Vaginitis: NEGATIVE
Chlamydia: NEGATIVE
Comment: NEGATIVE
Comment: NEGATIVE
Comment: NEGATIVE
Comment: NEGATIVE
Comment: NEGATIVE
Comment: NORMAL
Neisseria Gonorrhea: NEGATIVE
Trichomonas: NEGATIVE

## 2023-02-14 LAB — URINE CULTURE: Culture: NO GROWTH

## 2023-02-14 LAB — HIV ANTIBODY (ROUTINE TESTING W REFLEX): HIV Screen 4th Generation wRfx: NONREACTIVE

## 2023-02-14 LAB — RPR: RPR Ser Ql: NONREACTIVE

## 2023-02-15 ENCOUNTER — Telehealth (HOSPITAL_COMMUNITY): Payer: Self-pay | Admitting: Emergency Medicine

## 2023-02-15 MED ORDER — METRONIDAZOLE 500 MG PO TABS: 500.0000 mg | ORAL_TABLET | Freq: Two times a day (BID) | ORAL | 0 refills | Status: DC

## 2023-09-23 ENCOUNTER — Ambulatory Visit (HOSPITAL_COMMUNITY): Payer: BC Managed Care – PPO

## 2023-09-25 ENCOUNTER — Encounter (HOSPITAL_COMMUNITY): Payer: Self-pay

## 2023-09-25 ENCOUNTER — Ambulatory Visit (HOSPITAL_COMMUNITY)
Admission: RE | Admit: 2023-09-25 | Discharge: 2023-09-25 | Disposition: A | Discharge status: Home / Self Care | Payer: BC Managed Care – PPO | Source: Ambulatory Visit | Attending: Family Medicine | Admitting: Family Medicine

## 2023-09-25 VITALS — BP 113/77 | HR 96 | Temp 98.6°F | Resp 16 | Ht 62.0 in | Wt 210.0 lb

## 2023-09-25 DIAGNOSIS — N760 Acute vaginitis: Secondary | ICD-10-CM | POA: Insufficient documentation

## 2023-09-25 DIAGNOSIS — Z202 Contact with and (suspected) exposure to infections with a predominantly sexual mode of transmission: Secondary | ICD-10-CM | POA: Diagnosis present

## 2023-09-25 LAB — POCT URINALYSIS DIP (MANUAL ENTRY)
Bilirubin, UA: NEGATIVE
Blood, UA: NEGATIVE
Glucose, UA: NEGATIVE mg/dL
Ketones, POC UA: NEGATIVE mg/dL
Leukocytes, UA: NEGATIVE
Nitrite, UA: NEGATIVE
Protein Ur, POC: NEGATIVE mg/dL
Spec Grav, UA: 1.01 (ref 1.010–1.025)
Urobilinogen, UA: 0.2 U/dL
pH, UA: 7 (ref 5.0–8.0)

## 2023-09-25 LAB — RPR: RPR Ser Ql: NONREACTIVE

## 2023-09-25 LAB — HIV ANTIBODY (ROUTINE TESTING W REFLEX): HIV Screen 4th Generation wRfx: NONREACTIVE

## 2023-09-25 MED ORDER — METRONIDAZOLE 500 MG PO TABS: 500.0000 mg | ORAL_TABLET | Freq: Two times a day (BID) | ORAL | 0 refills | Status: AC

## 2023-09-25 NOTE — Discharge Instructions (Signed)
Take metronidazole 500 mg--1 tablet 2 times daily for 7 days.  Avoid drinking alcohol within 72 hours of taking this medication  Staff will notify you if there is anything positive on the swab or on the blood work (we drew blood to check HIV and syphilis tests).

## 2023-09-25 NOTE — ED Provider Notes (Signed)
[facility]    CSN: [id number] Arrival date & time: 09/25/23  0818      History   Chief Complaint Chief Complaint  Patient presents with   Vaginal Itching    Would like to get tested for STDs , or it may be BV. - Entered by patient    HPI Megan Mercado is a 32 y.o. female.    Vaginal Itching  Here for urinary frequency and vaginal itching and burning.  No actual dysuria with urination.  Symptoms began 10/10.   No abd pain.  LMP some time ago, has nexplanon.   No vaginal odor.   Past Medical History:  Diagnosis Date   Medical history non-contributory     Patient Active Problem List   Diagnosis Date Noted   Preterm premature rupture of membranes (PPROM) with onset of labor within 24 hours of rupture in third trimester, antepartum 07/14/2020    History reviewed. No pertinent surgical history.  OB History     Gravida  2   Para  1   Term      Preterm  1   AB  1   Living  1      SAB  1   IAB      Ectopic      Multiple  0   Live Births  1            Home Medications    Prior to Admission medications   Medication Sig Start Date End Date Taking? Authorizing Provider  etonogestrel (NEXPLANON) 68 MG IMPL implant 1 each by Subdermal route once.   Yes [provider]  metroNIDAZOLE (FLAGYL) 500 MG tablet Take 1 tablet (500 mg total) by mouth 2 (two) times daily for 7 days. 09/25/23 10/02/23 Yes Itzae Mccurdy, Janace Aris, MD    Family History Family History  Problem Relation Age of Onset   Healthy Mother    Diabetes Father    Diabetes Maternal Grandfather     Social History Social History   Tobacco Use   Smoking status: Never   Smokeless tobacco: Never  Vaping Use   Vaping status: Never Used  Substance Use Topics   Alcohol use: Never   Drug use: Never     Allergies   Augmentin [amoxicillin-pot clavulanate]   Review of Systems Review of Systems   Physical Exam Triage Vital Signs ED Triage Vitals  [09/25/23 0840]  Encounter Vitals Group     BP 113/77     Systolic BP Percentile      Diastolic BP Percentile      Pulse Rate 96     Resp 16     Temp 98.6 F (37 C)     Temp Source Oral     SpO2 98 %     Weight 210 lb (95.3 kg)     Height 5\' 2"  (1.575 m)     Head Circumference      Peak Flow      Pain Score 6     Pain Loc      Pain Education      Exclude from Growth Chart    No data found.  Updated Vital Signs BP 113/77 (BP Location: Left Arm)   Pulse 96   Temp 98.6 F (37 C) (Oral)   Resp 16   Ht 5\' 2"  (1.575 m)   Wt 95.3 kg   LMP  (LMP Unknown)   SpO2 98%   BMI 38.41 kg/m   Visual  Acuity Right Eye Distance:   Left Eye Distance:   Bilateral Distance:    Right Eye Near:   Left Eye Near:    Bilateral Near:     Physical Exam Vitals reviewed.  Constitutional:      General: She is not in acute distress.    Appearance: She is not ill-appearing, toxic-appearing or diaphoretic.  Cardiovascular:     Rate and Rhythm: Normal rate and regular rhythm.  Pulmonary:     Effort: Pulmonary effort is normal.     Breath sounds: Normal breath sounds.  Abdominal:     Palpations: Abdomen is soft.     Tenderness: There is no abdominal tenderness.  Skin:    Coloration: Skin is not pale.  Neurological:     Mental Status: She is alert and oriented to person, place, and time.      UC Treatments / Results  Labs (all labs ordered are listed, but only abnormal results are displayed) Labs Reviewed  POCT URINALYSIS DIP (MANUAL ENTRY) - Abnormal; Notable for the following components:      Result Value   Color, UA light yellow (*)    All other components within normal limits  HIV ANTIBODY (ROUTINE TESTING W REFLEX)  RPR  CERVICOVAGINAL ANCILLARY ONLY    EKG   Radiology No results found.  Procedures Procedures (including critical care time)  Medications Ordered in UC Medications - No data to display  Initial Impression / Assessment and Plan / UC Course  I have  reviewed the triage vital signs and the nursing notes.  Pertinent labs & imaging results that were available during my care of the patient were reviewed by me and considered in my medical decision making (see chart for details).      She does wish to be screened for HIV and syphilis so those are drawn.  Vaginal self swab is done, and we will notify of any positives on that and treat per protocol. We decided go ahead and try empiric treatment for BV with metronidazole.  Final Clinical Impressions(s) / UC Diagnoses   Final diagnoses:  Acute vaginitis  Exposure to STD     Discharge Instructions      Take metronidazole 500 mg--1 tablet 2 times daily for 7 days.  Avoid drinking alcohol within 72 hours of taking this medication  Staff will notify you if there is anything positive on the swab or on the blood work (we drew blood to check HIV and syphilis tests).      ED Prescriptions     Medication Sig Dispense Auth. Provider   metroNIDAZOLE (FLAGYL) 500 MG tablet Take 1 tablet (500 mg total) by mouth 2 (two) times daily for 7 days. 14 tablet Mohammed Mcandrew, Janace Aris, MD      PDMP not reviewed this encounter.   Zenia Resides, MD 09/25/23 [phone]

## 2023-09-25 NOTE — ED Triage Notes (Signed)
Patient here today with c/o vaginal itching and frequent urination X since Friday night. She would like to be tested for all STDs.

## 2023-09-26 LAB — CERVICOVAGINAL ANCILLARY ONLY
Bacterial Vaginitis (gardnerella): POSITIVE — AB
Candida Glabrata: NEGATIVE
Candida Vaginitis: NEGATIVE
Chlamydia: NEGATIVE
Comment: NEGATIVE
Comment: NEGATIVE
Comment: NEGATIVE
Comment: NEGATIVE
Comment: NEGATIVE
Comment: NORMAL
Neisseria Gonorrhea: NEGATIVE
Trichomonas: NEGATIVE

## 2023-12-26 ENCOUNTER — Ambulatory Visit
Admission: RE | Admit: 2023-12-26 | Discharge: 2023-12-26 | Disposition: A | Discharge status: Home / Self Care | Payer: BC Managed Care – PPO | Source: Ambulatory Visit | Attending: Family Medicine | Admitting: Family Medicine

## 2023-12-26 VITALS — BP 109/75 | HR 96 | Temp 98.8°F | Resp 20

## 2023-12-26 DIAGNOSIS — B9689 Other specified bacterial agents as the cause of diseases classified elsewhere: Secondary | ICD-10-CM | POA: Diagnosis present

## 2023-12-26 DIAGNOSIS — N760 Acute vaginitis: Secondary | ICD-10-CM | POA: Diagnosis present

## 2023-12-26 MED ORDER — METRONIDAZOLE 500 MG PO TABS: 500.0000 mg | ORAL_TABLET | Freq: Two times a day (BID) | ORAL | 0 refills | Status: AC

## 2023-12-26 MED ORDER — FLUCONAZOLE 150 MG PO TABS: 150.0000 mg | ORAL_TABLET | ORAL | 0 refills | Status: AC

## 2023-12-26 NOTE — ED Provider Notes (Signed)
 [facility]  Note:  This document was prepared using Conservation officer, historic buildings and may include unintentional dictation errors.  MRN: [id number] DOB: 01-16-1991  Subjective:   Megan Mercado is a 33 y.o. female presenting for 3-day history of acute onset persistent and recurrent vaginal discharge, vaginal burning and irritation.  No urinary symptoms.  Has a history of BV and yeast infections.  Would like to be treated empirically.  Would like to be checked for STIs.  No concern for pregnancy.  Has Nexplanon.  No current facility-administered medications for this encounter.  Current Outpatient Medications:    etonogestrel (NEXPLANON) 68 MG IMPL implant, 1 each by Subdermal route once., Disp: , Rfl:    Allergies  Allergen Reactions   Augmentin [Amoxicillin-Pot Clavulanate] Itching    Past Medical History:  Diagnosis Date   Medical history non-contributory      No past surgical history on file.  Family History  Problem Relation Age of Onset   Healthy Mother    Diabetes Father    Diabetes Maternal Grandfather     Social History   Tobacco Use   Smoking status: Never   Smokeless tobacco: Never  Vaping Use   Vaping status: Never Used  Substance Use Topics   Alcohol use: Never   Drug use: Never    ROS   Objective:   Vitals: BP 109/75 (BP Location: Left Arm)   Pulse 96   Temp 98.8 F (37.1 C) (Oral)   Resp 20   SpO2 97%   Physical Exam Constitutional:      General: She is not in acute distress.    Appearance: Normal appearance. She is well-developed. She is not ill-appearing, toxic-appearing or diaphoretic.  HENT:     Head: Normocephalic and atraumatic.     Nose: Nose normal.     Mouth/Throat:     Mouth: Mucous membranes are moist.     Pharynx: Oropharynx is clear.  Eyes:     General: No scleral icterus.       Right eye: No discharge.        Left eye: No discharge.     Extraocular Movements: Extraocular movements  intact.     Conjunctiva/sclera: Conjunctivae normal.  Cardiovascular:     Rate and Rhythm: Normal rate.  Pulmonary:     Effort: Pulmonary effort is normal.  Abdominal:     General: Bowel sounds are normal. There is no distension.     Palpations: Abdomen is soft. There is no mass.     Tenderness: There is no abdominal tenderness. There is no right CVA tenderness, left CVA tenderness, guarding or rebound.  Skin:    General: Skin is warm and dry.  Neurological:     General: No focal deficit present.     Mental Status: She is alert and oriented to person, place, and time.  Psychiatric:        Mood and Affect: Mood normal.        Behavior: Behavior normal.        Thought Content: Thought content normal.        Judgment: Judgment normal.       Assessment and Plan :   PDMP not reviewed this encounter.  1. Bacterial vaginosis   2. Acute vaginitis    We will treat patient empirically for bacterial vaginosis with Flagyl  and for yeast vaginitis with fluconazole .  Labs pending. Counseled patient on potential for adverse effects with medications prescribed/recommended today, ER and  return-to-clinic precautions discussed, patient verbalized understanding.    Christopher Savannah, PA-C 12/26/23 1452

## 2023-12-26 NOTE — Discharge Instructions (Addendum)
 Please start metronidazole to help with a BV infection and also fluconazole to address a yeast infection. We will update you with your results tomorrow.

## 2023-12-26 NOTE — ED Triage Notes (Signed)
 Pt requesting STD testing, c/o vaginal burning/denies discharge-took 3 day monostat with no change-NAd-steady gsit

## 2023-12-27 LAB — CERVICOVAGINAL ANCILLARY ONLY
Bacterial Vaginitis (gardnerella): POSITIVE — AB
Candida Glabrata: NEGATIVE
Candida Vaginitis: NEGATIVE
Chlamydia: NEGATIVE
Comment: NEGATIVE
Comment: NEGATIVE
Comment: NEGATIVE
Comment: NEGATIVE
Comment: NEGATIVE
Comment: NORMAL
Neisseria Gonorrhea: NEGATIVE
Trichomonas: NEGATIVE

## 2024-09-26 ENCOUNTER — Ambulatory Visit
Admission: RE | Admit: 2024-09-26 | Discharge: 2024-09-26 | Disposition: A | Discharge status: Home / Self Care | Source: Ambulatory Visit

## 2024-09-26 VITALS — BP 121/86 | HR 88 | Temp 98.4°F | Resp 17

## 2024-09-26 DIAGNOSIS — Z113 Encounter for screening for infections with a predominantly sexual mode of transmission: Secondary | ICD-10-CM | POA: Diagnosis present

## 2024-09-26 LAB — POCT URINE PREGNANCY: Preg Test, Ur: NEGATIVE

## 2024-09-26 NOTE — ED Triage Notes (Addendum)
 Pt presents for STD testing with blood work and check for BV/yeast. Denies exposure or symptoms

## 2024-09-26 NOTE — ED Provider Notes (Addendum)
 [facility]  Note:  This document was prepared using Dragon voice recognition software and may include unintentional dictation errors.  MRN: [id number] DOB: 1991/09/23  Subjective:   Megan Mercado is a 33 y.o. female presenting for evaluation for routine STD testing with serum blood testing.  Patient denies any known exposure or current symptoms but would like to be tested for BV and yeast due to previous history of both.  No dysuria, increased urinary frequency, vaginal discharge, vaginal pain, abdominal pain, flank pain.  No current facility-administered medications for this encounter.  Current Outpatient Medications:    etonogestrel (NEXPLANON) 68 MG IMPL implant, 1 each by Subdermal route once., Disp: , Rfl:    fluconazole  (DIFLUCAN ) 150 MG tablet, Take 1 tablet (150 mg total) by mouth once a week. (Patient not taking: Reported on 09/26/2024), Disp: 2 tablet, Rfl: 0   metroNIDAZOLE  (FLAGYL ) 500 MG tablet, Take 1 tablet (500 mg total) by mouth 2 (two) times daily with a meal. DO NOT CONSUME ALCOHOL WHILE TAKING THIS MEDICATION. (Patient not taking: Reported on 09/26/2024), Disp: 14 tablet, Rfl: 0   Allergies  Allergen Reactions   Augmentin [Amoxicillin-Pot Clavulanate] Itching    Past Medical History:  Diagnosis Date   Medical history non-contributory      History reviewed. No pertinent surgical history.  Family History  Problem Relation Age of Onset   Healthy Mother    Diabetes Father    Diabetes Maternal Grandfather     Social History   Tobacco Use   Smoking status: Never   Smokeless tobacco: Never  Vaping Use   Vaping status: Never Used  Substance Use Topics   Alcohol use: Never   Drug use: Never    ROS Refer to HPI for ROS details.  Objective:   Vitals: BP 121/86 (BP Location: Right Arm)   Pulse 88   Temp 98.4 F (36.9 C) (Oral)   Resp 17   SpO2 98%   Physical Exam Vitals and nursing note reviewed.  Constitutional:       General: She is not in acute distress.    Appearance: Normal appearance. She is well-developed. She is not ill-appearing or toxic-appearing.  HENT:     Head: Normocephalic and atraumatic.  Cardiovascular:     Rate and Rhythm: Normal rate.  Pulmonary:     Effort: Pulmonary effort is normal. No respiratory distress.  Skin:    General: Skin is warm and dry.  Neurological:     General: No focal deficit present.     Mental Status: She is alert and oriented to person, place, and time.  Psychiatric:        Mood and Affect: Mood normal.        Behavior: Behavior normal.     Procedures  Results for orders placed or performed during the hospital encounter of 09/26/24 (from the past 24 hours)  POCT urine pregnancy     Status: None   Collection Time: 09/26/24  6:20 PM  Result Value Ref Range   Preg Test, Ur Negative Negative    No results found.   Assessment and Plan :     Discharge Instructions       1. Screening for STD (sexually transmitted disease) (Primary) - POCT urine pregnancy completed UC is negative for pregnancy - Cervicovaginal swab collected in UC and sent to lab for further testing results should be available in 2 to 3 days. - If any abnormal findings with final report patient will be contacted  appropriate treatment provided - Continue to monitor for any symptoms if you develop any symptoms of STD or any other concerning symptoms follow-up in ER for further evaluation and management.       Myosha Cuadras B Kerman Pfost   Torrey Horseman, [place] B, NP 09/26/24 1840    Aurea Goodell B, NP 09/26/24 1841

## 2024-09-26 NOTE — Discharge Instructions (Signed)
  1. Screening for STD (sexually transmitted disease) (Primary) - POCT urine pregnancy completed UC is negative for pregnancy - Cervicovaginal swab collected in UC and sent to lab for further testing results should be available in 2 to 3 days. - If any abnormal findings with final report patient will be contacted appropriate treatment provided - Continue to monitor for any symptoms if you develop any symptoms of STD or any other concerning symptoms follow-up in ER for further evaluation and management.

## 2024-09-27 LAB — CERVICOVAGINAL ANCILLARY ONLY
Bacterial Vaginitis (gardnerella): NEGATIVE
Candida Glabrata: NEGATIVE
Candida Vaginitis: NEGATIVE
Chlamydia: NEGATIVE
Comment: NEGATIVE
Comment: NEGATIVE
Comment: NEGATIVE
Comment: NEGATIVE
Comment: NEGATIVE
Comment: NORMAL
Neisseria Gonorrhea: NEGATIVE
Trichomonas: NEGATIVE

## 2024-09-27 LAB — RPR: RPR Ser Ql: NONREACTIVE

## 2024-09-27 LAB — HIV ANTIBODY (ROUTINE TESTING W REFLEX): HIV Screen 4th Generation wRfx: NONREACTIVE
